# Patient Record
Sex: Female | Born: 1965 | Race: Black or African American | Hispanic: No | Marital: Married | Smoking: Former smoker
Health system: Southern US, Community
[De-identification: ages and names within clinical notes are randomized; demographics above are authoritative.]

## PROBLEM LIST (undated history)

## (undated) DIAGNOSIS — D649 Anemia, unspecified: Secondary | ICD-10-CM

## (undated) HISTORY — PX: ABDOMINAL HYSTERECTOMY: SHX81

## (undated) HISTORY — PX: OVARIAN CYST SURGERY: SHX726

## (undated) HISTORY — DX: Anemia, unspecified: D64.9

---

## 2002-11-29 ENCOUNTER — Other Ambulatory Visit: Admission: RE | Admit: 2002-11-29 | Discharge: 2002-11-29 | Payer: Self-pay | Admitting: Obstetrics and Gynecology

## 2005-01-29 ENCOUNTER — Other Ambulatory Visit: Admission: RE | Admit: 2005-01-29 | Discharge: 2005-01-29 | Payer: Self-pay | Admitting: Obstetrics & Gynecology

## 2006-08-31 ENCOUNTER — Encounter (INDEPENDENT_AMBULATORY_CARE_PROVIDER_SITE_OTHER): Payer: Self-pay | Admitting: Specialist

## 2006-08-31 ENCOUNTER — Ambulatory Visit (HOSPITAL_COMMUNITY): Admission: RE | Admit: 2006-08-31 | Discharge: 2006-08-31 | Payer: Self-pay | Admitting: Obstetrics and Gynecology

## 2007-11-02 ENCOUNTER — Encounter (INDEPENDENT_AMBULATORY_CARE_PROVIDER_SITE_OTHER): Payer: Self-pay | Admitting: Obstetrics and Gynecology

## 2007-11-02 ENCOUNTER — Inpatient Hospital Stay (HOSPITAL_COMMUNITY): Admission: RE | Admit: 2007-11-02 | Discharge: 2007-11-04 | Payer: Self-pay | Admitting: Obstetrics and Gynecology

## 2010-09-10 NOTE — Op Note (Signed)
Kylie [name], [name] NO.:  [id number]   MEDICAL RECORD NO.:  [id number]          PATIENT TYPE:  INP   LOCATION:  9316                          FACILITY:  [facility]   PHYSICIAN:  Miguel Aschoff, M.D.       DATE OF BIRTH:  08-03-1965   DATE OF PROCEDURE:  11/02/2007  DATE OF DISCHARGE:                               OPERATIVE REPORT   PREOPERATIVE DIAGNOSES:  1. Menorrhagia.  2. Uterine fibroids.   POSTOPERATIVE DIAGNOSES:  1. Menorrhagia.  2. Uterine fibroids.   PROCEDURE:  Laparoscopically-assisted total vaginal hysterectomy.   SURGEON:  Miguel Aschoff, MD   ASSISTANT:  Luvenia Redden, MD   ANESTHESIA:  General.   COMPLICATIONS:  None.   JUSTIFICATION:  The patient is a 45 year old black female with a history  of menorrhagia that has been treated in the past with endometrial  ablation and most recently a Mirena IUD.  In spite of these conservative  measures, the heavy bleeding has persisted with the patient having  uterine fibroids and persistent bleeding.  She presents now to undergo  laparoscopically-assisted hysterectomy as a definitive therapy to  control her heavy menses.  Risks and benefits of this procedure were  discussed with the patient.  In addition, she understands that the  possibility that abdominal hysterectomy may have to be performed has  been discussed and informed consent obtained.   PROCEDURE IN DETAIL:  The patient was taken to the operating room,  placed in supine position, and general anesthesia was administered  without difficulty.  She was then placed in dorsal lithotomy position,  prepped and draped in the usual sterile fashion.  A Foley catheter was  inserted.  At this point, examination under anesthesia revealed normal  external genitalia, normal Bartholin and Skene glands, and normal  urethra.  The vaginal vault was without gross lesion and the cervix was  without gross lesion.  The uterus was noted be enlarged approximately 10  weeks  equivalent size, irregular in shape with myomas present.  After  the examination under anesthesia, a speculum was placed in the vaginal  vault and Hulka tenaculum was placed through the cervix and held.  At  this point, attention was directed to the abdomen where a small  infraumbilical incision was made.  A Veress needle was inserted and then  the abdomen was insufflated with 3 L of CO2.  Following insufflation,  the trocar to laparoscope was placed followed by laparoscope itself.  Then under direct visualization, two accessory ports were established in  the right and left lower quadrants using 5 mm ports.  At this point,  systematic inspection revealed the uterus again to be enlarged and  irregular.  There was a fundal fibroid approximately 4-5 cm in size on  the left portion of the fundus.  The tubes and ovaries were noted be  within normal limits except for what appeared to be evidence of prior  tubal sterilization.  The cul-de-sac was unremarkable.  The anterior  peritoneum was also unremarkable.  Intestinal surfaces were within  normal limits.  At this point, the Gyrus  unit was introduced.  The round  ligament was identified, grasped, cauterized and cut, and then in  similar fashion bilaterally the utero-ovarian ligaments were found,  cauterized, and cut, and then serial bites were taken along the broad  ligament working down to the uterine vessels using serial cauterization  and cuts until the uterine vessels were found.  At this point, the  laparoscopic portion of the procedure was completed.  The patient was  placed in a high dorsal lithotomy position.  Weighted speculum placed in  the vaginal vault, anterior cervical lip was grasped, and the cervix was  injected with 1% Xylocaine with epinephrine for hemostasis.  The  cervical mucosa was then circumscribed and dissected anteriorly and  posteriorly until the peritoneal reflections were found and then  entered.  At this point, the  uterosacral ligaments were found, clamped  with curved Heaney clamps, pedicles cut and suture ligated using suture  ligatures of 0 Vicryl.  These pedicles were then held and then bites  were taken off the cardinal ligaments using curved Heaney clamps.  Again, pedicles were cut and suture ligated using suture ligatures of 0  Vicryl.  Then serial bites were taken of the paracervical fascia again  with curved Heaney clamps and again all suture ligatures of 0 Vicryl  were used.  This continued until the uterine vessels were found,  clamped, cut, and suture ligated and again the anterior peritoneum was  entered without difficulty with care to avoid any injury to the bladder.  Two additional bites were taken of the broad ligament structures using  curved Heaney clamps and again suture ligatures of 0 Vicryl and at this  point, it was possible to deliver the uterine fundus through the cul-de-  sac reducing it in size by extracting the myomas and the residual  structures holding the uterus in place were clamped with curved Heaney  clamps and the specimens were free.  These pedicles were then ligated  using ligatures of 0 Vicryl.  At this point, the posterior vaginal cuff  was run using running interlocking 0 Vicryl suture.  Hemostasis was then  ensured on all pedicles and at this point, the peritoneum was closed  using pursestring suture of 0 Vicryl.  Vaginal mucosa was then  reapproximated using running interlocking 0 Vicryl sutures.  Iodoform  pack was then placed in the vaginal vault to provide additional pressure  hemostasis.  At this point, the patient was taken out of the lithotomy  position and placed in Trendelenburg position.  The laparoscope was  reintroduced.  The abdomen was reinsufflated and all pedicles were then  inspected intra-abdominally and appeared to be dry with good hemostasis.  At this point, the pelvis was irrigated again with saline.  This was  then aspirated.  The CO2 was  then allowed to escape.  All instruments  were removed.  The small infraumbilical incision as well as the  accessory port sites were closed using subcuticular 4-0 Vicryl.  Band-  Aids were applied.  At this point, the patient was reversed from the  anesthetic and taken to the recovery room in satisfactory condition.  The estimated blood loss was approximately 400 mL.  The patient  tolerated the procedure well and went to the recovery room in  satisfactory condition.  The urine remained clear throughout the case.      Miguel Aschoff, M.D.  Electronically Signed     AR/MEDQ  D:  11/02/2007  T:  11/03/2007  Job:  [id number]

## 2010-09-13 NOTE — Discharge Summary (Signed)
Kylie [name], ZAGAL NO.:  [id number]   MEDICAL RECORD NO.:  [id number]          PATIENT TYPE:  INP   LOCATION:  9316                          [facility]ACILITY:  [facility]   PHYSICIAN:  Miguel Aschoff, M.D.       DATE OF BIRTH:  1965/10/23   DATE OF ADMISSION:  11/02/2007  DATE OF DISCHARGE:  11/04/2007                               DISCHARGE SUMMARY   ADMISSION DIAGNOSIS:  Symptomatic uterine fibroids and menorrhagia.   FINAL DIAGNOSIS:  Symptomatic uterine fibroids and menorrhagia.   OPERATIONS AND PROCEDURES:  Laparoscopically-assisted total vaginal  hysterectomy.   ANESTHESIA:  General anesthesia.   BRIEF HISTORY:  The patient is a 45 year old black female with history  of menorrhagia and noted to have uterine fibroids.  Because of her  symptomatology and refractoriness of this condition to medical therapy,  she is being admitted to the hospital at this time to undergo definitive  therapy via laparoscopically-assisted hysterectomy and possible  abdominal hysterectomy.  The patient was informed about the procedures,  the risks, and benefits and signed informed consent.  On November 02, 2007,  after general anesthesia, a laparoscopically-assisted total vaginal  hysterectomy was carried out without difficulty.  Findings at surgery  revealed the presence of significant myomas, the largest one on the  uterine fundus.  The procedure was carried out without difficulty and  the patient had an essentially uncomplicated postoperative course,  except for a drop in her hemoglobin from 10.3-7.7 for which she remained  asymptomatic.  On second postoperative day, she was tolerating regular  diet, ambulating well, tolerating p.o. pain medications, and was felt to  be stable enough to be discharged home.  Medications for home included  Tylox one every 3 hours as needed for pain and she was instructed to  resume her iron treatment.  The patient is to be seen back in 4 weeks  for followup  examination.  She knows to call if there were any problems  such as fever, pain, or heavy bleeding.  Pathology specimen revealed the  uterus weighing 218 g.  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  The patient was sent home in  satisfactory condition on a regular diet.      Miguel Aschoff, M.D.  Electronically Signed     AR/MEDQ  D:  11/09/2007  T:  11/10/2007  Job:  [id number]

## 2010-09-13 NOTE — Op Note (Signed)
Kylie [name], Kylie [name]               ACCOUNT NO.:  [id number]   MEDICAL RECORD NO.:  [id number]          PATIENT TYPE:  AMB   LOCATION:  SDC                           [facility]ILITY:  [facility]   PHYSICIAN:  Kendra H. Tenny Craw, MD     DATE OF BIRTH:  Apr 24, 1966   DATE OF PROCEDURE:  09/03/2006  DATE OF DISCHARGE:  08/31/2006                               OPERATIVE REPORT   PREOPERATIVE DIAGNOSES:  1. Menorrhagia.  2. Anemia.   POSTOPERATIVE DIAGNOSES:  1. Menorrhagia.  2. Anemia.  3. Submucosal fibroids.   OPERATION PERFORMED:  Hysteroscopy, dilation and curettage, attempted  endometrial ablation with Novasure x2, with Thermachoice x1 and Mirena  IUD insertion.   SURGEON:  Freddrick March. Tenny Craw, MD   ESTIMATED BLOOD LOSS:  Approximately 50 mL.   ANESTHESIA:  General   SPECIMENS:  Endometrial curettings, disposition to pathology.   COMPLICATIONS:  Unsuccessful Novasure ablation x2 and Thermachoice  ablation x1, submucosal fibroid at the fundus at approximately 2 o'clock  and a submucosal fibroid at approximately 9 o'clock just inside the  internal cervical os.   INDICATIONS FOR PROCEDURE:  Ms. Swaby is a 45 year old African-American  female who has had approximately one and a half year history of  irregular heavy bleeding associated with anemia with a hemoglobin of 8.  She has attempted cycle control with oral contraceptives, however,  continues to have irregular bleeding and the decision was made to  proceed with attempted endometrial ablation.  Prior to the procedure,  she did have an endometrial biopsy performed which was benign  endometrium.   DESCRIPTION OF PROCEDURE:  She was taken to the operating room after the  appropriate informed consent and was prepped and draped in the normal  sterile fashion and placed in the dorsal supine position with the Allen  stirrups.  A single toothed tenaculum was placed on the anterior lip of  the cervix.  The uterus was sounded to 9 cm.  The cervix  was serially  dilated and the 0 degree hysteroscope was passed transcervically.  On  initial inspection the ostia were visualized x2.  The surface of the  endometrium was benign-appearing and the hysteroscope was removed. A  sharp curettage was performed.  On curettage there was noted to be some  irregular surface of the endometrial cavity felt consistent with  submucosal fibroids.  After the sharp curettage was performed, the  cervical length was measured to be 5 cm.  The Novasure device was placed  transcervically and the array was deployed.  The cavity width was  measured to be 3.1 cm.  The cavity assessment was performed; however,  the cavity assessment was not successful and despite multiple  techniques, we were not able to achieve a successful cavity assessment.  A second Novasure array was attempted and a second Novasure machine was  attempted and a successful cavity assessment was never able to be  achieved.  At this time the Novasure array was removed from the  endometrial cavity and the decision was made to attempt a Thermachoice  ablation.  D5W was placed into the hysteroscopic  tubing.  The  hysteroscopic tubing was flushed as was the hysteroscope.  The  hysteroscope was then passed transcervically and the uterine cavity was  flushed of normal saline.  At this time a small submucosal fibroid was  noted on the left hand side of the uterus just past the internal os and  also another submucosal fibroid was noted at the fundus on the right  hand side of the uterus.  No evidence of perforation was noted and the  fluid deficit remained less than 100 mL.  At this time the Thermachoice  ablative device was primed and passed transcervically.  However, despite  two attempts we were never able to maintain enough pressure to perform  the Thermachoice ablation.  At this time, the decision was made to place  a Jearld Adjutant IUD for short term control of the patient's menorrhagia.  The  Jearld Adjutant was  placed transcervically without difficulty.  This concluded  the operative procedure.  The patient was awoken from general anesthesia  and extubated in the operating room and brought to the recovery room in  stable condition following the procedure.  The patient was advised of  the unsuccessful attempt of Novasure and Thermachoice ablation and  placement of the [facility] IUD for short term control of her menorrhagia  and advised that she may require definitive surgical management with  hysterectomy for definitive control of her menorrhagia.      Freddrick March. Tenny Craw, MD  Electronically Signed     KHR/MEDQ  D:  09/03/2006  T:  09/03/2006  Job:  [id number]

## 2010-09-13 NOTE — Discharge Summary (Signed)
[name], [name] NO.:  [id number]   MEDICAL RECORD NO.:  [id number]          PATIENT TYPE:  INP   LOCATION:  9316                          [facility]   PHYSICIAN:  Miguel Aschoff, M.D.       DATE OF BIRTH:  19-Jan-1966   DATE OF ADMISSION:  11/02/2007  DATE OF DISCHARGE:  11/04/2007                               DISCHARGE SUMMARY   ADMISSION DIAGNOSIS:  Menorrhagia.   FINAL DIAGNOSES:  Menorrhagia with leiomyomas, superficial adenomyosis.   OPERATIONS AND PROCEDURES:  Laparoscopically-assisted total vaginal  hysterectomy.   BRIEF HISTORY:  The patient is a 45 year old black female with history  of menorrhagia treated in the past with a Mirena IUD and an endometrial  ablation.  However, these conservative measures did not result in arrest  of the patient's heavy vaginal bleeding and she presents now to undergo  definitive therapy for menorrhagia.  The risks and benefits of the  surgical procedures were discussed with the patient and preoperative  labs were obtained.  Preoperative labs revealed a hemoglobin of 10.3,  hematocrit 33.3, white count of 5600.  PT and PTT within normal limits.  The patient's blood type is noted to be A positive.   HOSPITAL COURSE:  On November 02, 2007, under general anesthesia,  laparoscopically-assisted total vaginal hysterectomy was carried out  without difficulty.  The patient had an essentially uncomplicated  postoperative course.  She tolerated increasing ambulation and diet  well.  The only remarkable postoperative issue was a drop in her  hemoglobin to 7.7; however, the patient remained asymptomatic.  By the  second postoperative day, the patient was in satisfactory condition and  discharged home.   Medications for home included Tylox 1 every 3 hours as needed for pain,  Slow Fe Iron 2 tablets per day.  She was instructed on no heavy lifting,  place nothing in vagina, to return to the office in 4 weeks for followup  examination.  The pathology report on the hysterectomy specimen revealed  superficial adenomyosis, benign leiomyomas, chronic cervicitis.      Miguel Aschoff, M.D.  Electronically Signed     AR/MEDQ  D:  11/24/2007  T:  11/24/2007  Job:  [id number]

## 2011-01-23 LAB — CROSSMATCH: Antibody Screen: NEGATIVE

## 2011-01-23 LAB — CBC
HCT: 24.9 — ABNORMAL LOW
HCT: 26.7 — ABNORMAL LOW
HCT: 33.3 — ABNORMAL LOW
Hemoglobin: 10.3 — ABNORMAL LOW
Hemoglobin: 7.7 — CL
Hemoglobin: 8.5 — ABNORMAL LOW
MCHC: 30.8
MCV: 71.1 — ABNORMAL LOW
MCV: 71.9 — ABNORMAL LOW
Platelets: 230
Platelets: 275
RDW: 22.1 — ABNORMAL HIGH
RDW: 22.8 — ABNORMAL HIGH
RDW: 23.1 — ABNORMAL HIGH
WBC: 11 — ABNORMAL HIGH

## 2011-01-23 LAB — DIFFERENTIAL
Eosinophils Absolute: 0
Eosinophils Relative: 1
Lymphocytes Relative: 32
Lymphs Abs: 1.8
Monocytes Absolute: 0.4

## 2011-01-23 LAB — HEMOGLOBIN AND HEMATOCRIT, BLOOD: HCT: 28.8 — ABNORMAL LOW

## 2011-01-23 LAB — PROTIME-INR
INR: 1
Prothrombin Time: 13.1

## 2012-09-02 ENCOUNTER — Other Ambulatory Visit: Payer: Self-pay | Admitting: Internal Medicine

## 2012-09-02 DIAGNOSIS — Z1231 Encounter for screening mammogram for malignant neoplasm of breast: Secondary | ICD-10-CM

## 2012-10-13 ENCOUNTER — Ambulatory Visit: Payer: Self-pay

## 2013-02-15 ENCOUNTER — Other Ambulatory Visit: Payer: Self-pay | Admitting: Obstetrics and Gynecology

## 2013-02-15 DIAGNOSIS — Z1231 Encounter for screening mammogram for malignant neoplasm of breast: Secondary | ICD-10-CM

## 2013-03-04 ENCOUNTER — Ambulatory Visit
Admission: RE | Admit: 2013-03-04 | Discharge: 2013-03-04 | Disposition: A | Discharge status: Home / Self Care | Payer: BC Managed Care – PPO | Source: Ambulatory Visit | Attending: Obstetrics and Gynecology | Admitting: Obstetrics and Gynecology

## 2013-03-04 DIAGNOSIS — Z1231 Encounter for screening mammogram for malignant neoplasm of breast: Secondary | ICD-10-CM

## 2013-04-28 ENCOUNTER — Emergency Department (HOSPITAL_COMMUNITY): Payer: BC Managed Care – PPO

## 2013-04-28 ENCOUNTER — Emergency Department (HOSPITAL_COMMUNITY)
Admission: EM | Admit: 2013-04-28 | Discharge: 2013-04-28 | Disposition: A | Discharge status: Home / Self Care | Payer: BC Managed Care – PPO | Attending: Emergency Medicine | Admitting: Emergency Medicine

## 2013-04-28 ENCOUNTER — Encounter (HOSPITAL_COMMUNITY): Payer: Self-pay | Admitting: Emergency Medicine

## 2013-04-28 DIAGNOSIS — X500XXA Overexertion from strenuous movement or load, initial encounter: Secondary | ICD-10-CM | POA: Insufficient documentation

## 2013-04-28 DIAGNOSIS — Z87891 Personal history of nicotine dependence: Secondary | ICD-10-CM | POA: Diagnosis not present

## 2013-04-28 DIAGNOSIS — S92109A Unspecified fracture of unspecified talus, initial encounter for closed fracture: Secondary | ICD-10-CM | POA: Insufficient documentation

## 2013-04-28 DIAGNOSIS — S8990XA Unspecified injury of unspecified lower leg, initial encounter: Secondary | ICD-10-CM | POA: Diagnosis present

## 2013-04-28 DIAGNOSIS — Y9389 Activity, other specified: Secondary | ICD-10-CM | POA: Diagnosis not present

## 2013-04-28 DIAGNOSIS — S92009A Unspecified fracture of unspecified calcaneus, initial encounter for closed fracture: Secondary | ICD-10-CM | POA: Diagnosis not present

## 2013-04-28 DIAGNOSIS — Z79899 Other long term (current) drug therapy: Secondary | ICD-10-CM | POA: Diagnosis not present

## 2013-04-28 DIAGNOSIS — Y929 Unspecified place or not applicable: Secondary | ICD-10-CM | POA: Diagnosis not present

## 2013-04-28 DIAGNOSIS — S99919A Unspecified injury of unspecified ankle, initial encounter: Secondary | ICD-10-CM | POA: Diagnosis present

## 2013-04-28 DIAGNOSIS — S82891A Other fracture of right lower leg, initial encounter for closed fracture: Secondary | ICD-10-CM

## 2013-04-28 MED ORDER — HYDROCODONE-ACETAMINOPHEN 5-325 MG PO TABS: 1.0000 | ORAL_TABLET | ORAL | Status: DC | PRN

## 2013-04-28 NOTE — ED Notes (Signed)
Pt states she stepped off a step wrong and bent her right foot under.  Pt c/o pain and swelling to right lateral foot.  Pt has good pulses in extremity and skin is warm and dry to touch.

## 2013-04-28 NOTE — Discharge Instructions (Signed)
Read the information below.  Use the prescribed medication as directed.  Please discuss all new medications with your pharmacist.  Do not take additional tylenol while taking the prescribed pain medication to avoid overdose.  You may return to the Emergency Department at any time for worsening condition or any new symptoms that concern you.  If you develop uncontrolled pain, weakness or numbness of the extremity, severe discoloration of the skin, or you are unable to move your ankle or foot , return to the ER for a recheck.

## 2013-04-28 NOTE — ED Notes (Signed)
Pt presents with c/o right foot injury. Pt was walking down some stairs and missed the last step, pt says she twisted her foot at this time. Pt now presenting with pain on the top of her foot. Ambulatory to triage.

## 2013-04-28 NOTE — ED Provider Notes (Signed)
Medical screening examination/treatment/procedure(s) were performed by non-physician practitioner and as supervising physician I was immediately available for consultation/collaboration.  Charnell Peplinski L Keishawn Darsey, MD 04/28/13 2309 

## 2013-04-28 NOTE — ED Provider Notes (Signed)
CSN: [id number]     Arrival date & time 04/28/13  1519 History  This chart was scribed for non-physician practitioner Clayton Bibles, PA-C working with Richarda Blade, MD by Anastasia Pall, ED scribe. This patient was seen in room [place] and the patient's care was started at 4:39 PM.   Chief Complaint  Patient presents with  . Foot Injury    The history is provided by the patient. No language interpreter was used.   HPI Comments: Kylie [name] is a 48 y.o. female who presents to the Emergency Department complaining of sudden, mild, constant, right foot pain, over the top of her foot, with associated swelling, onset yesterday when she twisted her foot after missing the last step on some stairs. She denies pain to her foot when resting, but reports pain with ambulation. She denies fever, vomiting, back pain, and any other associated symptoms. Denies other injury.   PCP - No primary provider on file.  History reviewed. No pertinent past medical history. Past Surgical History  Procedure Laterality Date  . Abdominal hysterectomy    . Cesarean section     No family history on file. History  Substance Use Topics  . Smoking status: Former Research scientist (life sciences)  . Smokeless tobacco: Never Used  . Alcohol Use: No   OB History   Grav Para Term Preterm Abortions TAB SAB Ect Mult Living                 Review of Systems  Constitutional: Negative for fever.  Gastrointestinal: Negative for vomiting.  Musculoskeletal: Positive for arthralgias (right foot). Negative for back pain.    Allergies  Review of patient's allergies indicates no known allergies.  Home Medications   Current Outpatient Rx  Name  Route  Sig  Dispense  Refill  . Green Tea, Camillia sinensis, (GREEN TEA PO)   Oral   Take 1 tablet by mouth daily.         . Multiple Vitamins-Minerals (MULTIPLE VITAMINS/WOMENS PO)   Oral   Take 1 each by mouth daily.         . phentermine (ADIPEX-P) 37.5 MG tablet   Oral   Take 37.5 mg by  mouth daily before breakfast.           BP 132/87  Pulse 103  Temp(Src) 98.5 F (36.9 C) (Oral)  Resp 18  SpO2 100%  Physical Exam  Nursing note and vitals reviewed. Constitutional: She appears well-developed and well-nourished. No distress.  HENT:  Head: Normocephalic and atraumatic.  Neck: Neck supple.  Cardiovascular:   Right foot cap refill < 2 seconds.   Pulmonary/Chest: Effort normal.  Musculoskeletal:  Swelling and tenderness over lateral mall and dorsal foot on right side. Can move all digits and ankle. No calf tenderness.   Neurological: She is alert.  Sensation of right foot intact.  Skin: She is not diaphoretic.    ED Course  Procedures (including critical care time)  DIAGNOSTIC STUDIES: Oxygen Saturation is 100% on room air, normal by my interpretation.    COORDINATION OF CARE: 4:41 PM-Discussed treatment plan which includes right foot xray with pt at bedside and pt agreed to plan.   Labs Review Labs Reviewed - No data to display Imaging Review No results found.  EKG Interpretation   None      Discussed cam walker vs splint with patient. Patient works in [employer] and is also uncomfortable using crutches. She strongly prefers the cam walker  MDM   1.  Avulsion fracture of ankle, right, closed, initial encounter    Patient with right foot swelling and mild pain while walking downstairs in missing a step causing her to invert her foot. She has 2 avulsion fractures per x-ray. Please see discussion above about treatment. Patient discharged with cam walker, Norco, orthopedic followup.  Discussed result, findings, treatment, and follow up  with patient.  Pt given return precautions.  Pt verbalizes understanding and agrees with plan.        I personally performed the services described in this documentation, which was scribed in my presence. The recorded information has been reviewed and is accurate.     Clayton Bibles, PA-C 04/28/13 1858

## 2013-05-24 ENCOUNTER — Ambulatory Visit (INDEPENDENT_AMBULATORY_CARE_PROVIDER_SITE_OTHER): Payer: BC Managed Care – PPO | Admitting: Family Medicine

## 2013-05-24 VITALS — BP 132/84 | HR 98 | Temp 98.2°F | Resp 16 | Ht 67.5 in | Wt 269.0 lb

## 2013-05-24 DIAGNOSIS — M25579 Pain in unspecified ankle and joints of unspecified foot: Secondary | ICD-10-CM

## 2013-05-24 DIAGNOSIS — M25571 Pain in right ankle and joints of right foot: Secondary | ICD-10-CM

## 2013-05-24 NOTE — Progress Notes (Signed)
[facility] [address], [place] [address] [phone]  Date:  05/24/2013   Name:  Kylie [name]   DOB:  April 23, 1966   MRN:  [id number]  PCP:  No primary provider on file.    Chief Complaint: Annual Exam   History of Present Illness:  Kylie [name] is a 48 y.o. very pleasant female patient who presents with the following:  She is here today for a PE to take care of foster children.  She has an ankle fracture right now in a CAM boot- this is being treated by her orthopedist.  She hopes to be out of the boot in 2 weeks.  She does take norco prn for her ankle but rarely uses.  She is on phentermine.   Otherwise she is generally healthy.   On her form there are the same questions we use to screen for TB.  She has answered negatively to all.   She does not desire full PE services today  There are no active problems to display for this patient.   No past medical history on file.  Past Surgical History  Procedure Laterality Date  . Abdominal hysterectomy    . Cesarean section      History  Substance Use Topics  . Smoking status: Former Research scientist (life sciences)  . Smokeless tobacco: Never Used  . Alcohol Use: No    No family history on file.  No Known Allergies  Medication list has been reviewed and updated.  Current Outpatient Prescriptions on File Prior to Visit  Medication Sig Dispense Refill  . Green Tea, Camillia sinensis, (GREEN TEA PO) Take 1 tablet by mouth daily.      . Multiple Vitamins-Minerals (MULTIPLE VITAMINS/WOMENS PO) Take 1 each by mouth daily.      . phentermine (ADIPEX-P) 37.5 MG tablet Take 37.5 mg by mouth daily before breakfast.      . HYDROcodone-acetaminophen (NORCO/VICODIN) 5-325 MG per tablet Take 1 tablet by mouth every 4 (four) hours as needed.  15 tablet  0   No current facility-administered medications on file prior to visit.    Review of Systems:  As per HPI- otherwise negative.   Physical Examination: Filed Vitals:   05/24/13 1116  BP: 132/84  Pulse: 98  Temp: 98.2 F (36.8 C)  Resp: 16   Filed Vitals:   05/24/13 1116  Height: 5' 7.5" (1.715 m)  Weight: 269 lb (122.018 kg)   Body mass index is 41.49 kg/(m^2). Ideal Body Weight: Weight in (lb) to have BMI = 25: 161.7  GEN: WDWN, NAD, Non-toxic, A & O x 3 HEENT: Atraumatic, Normocephalic. Neck supple. No masses, No LAD.  Bilateral TM wnl, oropharynx normal.  PEERL,EOMI.   Ears and Nose: No external deformity. CV: RRR, No M/G/R. No JVD. No thrill. No extra heart sounds. PULM: CTA B, no wheezes, crackles, rhonchi. No retractions. No resp. distress. No accessory muscle use. ABD: S, NT, ND. No rebound. No HSM. EXTR: No c/c/e NEURO using crutches and a CAM walker boot PSYCH: Normally interactive. Conversant. Not depressed or anxious appearing.  Calm demeanor.  Right ankle: tender and bruised over the dorsum of the foot.  Ankle joint is stable  Assessment and Plan: Right ankle pain  Completed form for foster program Declines a flu shot today  Signed Lamar Blinks, MD

## 2014-08-22 ENCOUNTER — Encounter (HOSPITAL_COMMUNITY): Payer: Self-pay | Admitting: Emergency Medicine

## 2014-08-22 ENCOUNTER — Emergency Department (HOSPITAL_COMMUNITY)
Admission: EM | Admit: 2014-08-22 | Discharge: 2014-08-22 | Disposition: A | Discharge status: Home / Self Care | Payer: BC Managed Care – PPO | Attending: Emergency Medicine | Admitting: Emergency Medicine

## 2014-08-22 DIAGNOSIS — Z87891 Personal history of nicotine dependence: Secondary | ICD-10-CM | POA: Insufficient documentation

## 2014-08-22 DIAGNOSIS — Y998 Other external cause status: Secondary | ICD-10-CM | POA: Diagnosis not present

## 2014-08-22 DIAGNOSIS — Y9389 Activity, other specified: Secondary | ICD-10-CM | POA: Insufficient documentation

## 2014-08-22 DIAGNOSIS — X58XXXA Exposure to other specified factors, initial encounter: Secondary | ICD-10-CM | POA: Diagnosis not present

## 2014-08-22 DIAGNOSIS — Y929 Unspecified place or not applicable: Secondary | ICD-10-CM | POA: Insufficient documentation

## 2014-08-22 DIAGNOSIS — Z79899 Other long term (current) drug therapy: Secondary | ICD-10-CM | POA: Diagnosis not present

## 2014-08-22 DIAGNOSIS — S93401A Sprain of unspecified ligament of right ankle, initial encounter: Secondary | ICD-10-CM | POA: Insufficient documentation

## 2014-08-22 DIAGNOSIS — S99911A Unspecified injury of right ankle, initial encounter: Secondary | ICD-10-CM | POA: Diagnosis present

## 2014-08-22 NOTE — Discharge Instructions (Signed)
Please read and follow all provided instructions.  Your diagnoses today include:  1. Ankle sprain, right, initial encounter     Tests performed today include:  Vital signs. See below for your results today.   Medications prescribed:   Ibuprofen (Motrin, Advil) - anti-inflammatory pain medication  Do not exceed 600mg  ibuprofen every 6 hours, take with food  You have been prescribed an anti-inflammatory medication or NSAID. Take with food. Take smallest effective dose for the shortest duration needed for your pain. Stop taking if you experience stomach pain or vomiting.   Take any prescribed medications only as directed.  Home care instructions:   Follow any educational materials contained in this packet  Follow R.I.C.E. Protocol:  R - rest your injury   I  - use ice on injury without applying directly to skin  C - compress injury with bandage or splint  E - elevate the injury as much as possible  Follow-up instructions: Please follow-up with your primary care provider if you continue to have significant pain or trouble walking in 1 week. In this case you may have a severe sprain that requires further care.   Return instructions:   Please return if your toes are numb or tingling, appear gray or blue, or you have severe pain (also elevate leg and loosen splint or wrap)  Please return to the Emergency Department if you experience worsening symptoms.   Please return if you have any other emergent concerns.  Additional Information:  Your vital signs today were: BP 126/76 mmHg   Pulse 78   Temp(Src) 98.2 F (36.8 C) (Oral)   Resp 16   Ht 5\' 7"  (1.702 m)   Wt 269 lb (122.018 kg)   BMI 42.12 kg/m2   SpO2 100% If your blood pressure (BP) was elevated above 135/85 this visit, please have this repeated by your doctor within one month. -------------- Your caregiver has diagnosed you as suffering from an ankle sprain. Ankle sprain occurs when the ligaments that hold the ankle  joint together are stretched or torn. It may take 4 to 6 weeks to heal.  For Activity: If prescribed crutches, use crutches with non-weight bearing for the first few days. Then, you may walk on your ankle as the pain allows, or as instructed. Start gradually with weight bearing on the affected ankle. Once you can walk pain free, then try jogging. When you can run forwards, then you can try moving side-to-side. If you cannot walk without crutches in one week, you need a re-check. --------------

## 2014-08-22 NOTE — ED Provider Notes (Signed)
CSN: [id number]     Arrival date & time 08/22/14  1247 History  This chart was scribed for Alecia Lemming PA-C working with Fredia Sorrow, MD by Mercy Moore, ED Scribe. This patient was seen in room TR07C/TR07C and the patient's care was started at 1:13 PM.   Chief Complaint  Patient presents with  . Ankle Pain   The history is provided by the patient. No language interpreter was used.   HPI Comments: Kylie [name] is a 49 y.o. female who presents to the Emergency Department with a right ankle injury incurred yesterday. Patient reports inverting her right ankle on a curb while unloading groceries from her car. Patient denies falling or any additional injuries. Patient denies swelling  following the injury, but reports gradual onset throbbing pain to dorsum approximately three hours later with drastic worsening last night. Patient reports treatment with ibuprofen, with some relief. Patient denies knee involvement. Patient denies numbness or tingling in her toes. Patient reports previous fracture to the ankle several years ago. Patient has been ambulating with the crutches from her previous injury.   History reviewed. No pertinent past medical history. Past Surgical History  Procedure Laterality Date  . Abdominal hysterectomy    . Cesarean section     No family history on file. History  Substance Use Topics  . Smoking status: Former Research scientist (life sciences)  . Smokeless tobacco: Never Used  . Alcohol Use: No   OB History    No data available     Review of Systems  Constitutional: Negative for fever, chills and activity change.  Musculoskeletal: Positive for arthralgias. Negative for back pain, joint swelling, gait problem and neck pain.  Skin: Negative for color change and wound.  Neurological: Negative for weakness and numbness.      Allergies  Review of patient's allergies indicates no known allergies.  Home Medications   Prior to Admission medications   Medication Sig Start Date End Date  Taking? Authorizing Provider  Nyoka Cowden Tea, Camillia sinensis, (GREEN TEA PO) Take 1 tablet by mouth daily.    Historical Provider, MD  HYDROcodone-acetaminophen (NORCO/VICODIN) 5-325 MG per tablet Take 1 tablet by mouth every 4 (four) hours as needed. 04/28/13   Clayton Bibles, PA-C  Multiple Vitamins-Minerals (MULTIPLE VITAMINS/WOMENS PO) Take 1 each by mouth daily.    Historical Provider, MD  phentermine (ADIPEX-P) 37.5 MG tablet Take 37.5 mg by mouth daily before breakfast.    Historical Provider, MD   Triage Vitals: BP 126/76 mmHg  Pulse 78  Temp(Src) 98.2 F (36.8 C) (Oral)  Resp 16  Ht 5\' 7"  (1.702 m)  Wt 269 lb (122.018 kg)  BMI 42.12 kg/m2  SpO2 100%  Physical Exam  Constitutional: She is oriented to person, place, and time. She appears well-developed and well-nourished. No distress.  HENT:  Head: Normocephalic and atraumatic.  Eyes: EOM are normal. Pupils are equal, round, and reactive to light.  Neck: Normal range of motion. Neck supple. No tracheal deviation present.  Cardiovascular: Normal rate.  Exam reveals no decreased pulses.   Pulmonary/Chest: Effort normal. No respiratory distress.  Musculoskeletal: Normal range of motion. She exhibits no edema or tenderness.  No tenderness at time of exam. Mentions slight pressure medial R ankle with standing, but with normal gait.   Neurological: She is alert and oriented to person, place, and time. No sensory deficit.  Motor, sensation, and vascular distal to the injury is fully intact.   Skin: Skin is warm and dry.  Psychiatric: She has a  normal mood and affect. Her behavior is normal.  Nursing note and vitals reviewed.   ED Course  Procedures (including critical care time)  COORDINATION OF CARE:  1:19 PM- Discussed treatment plan with patient at bedside and patient agreed to plan. Patient agrees no x-ray needed at this time.   Labs Review Labs Reviewed - No data to display  Imaging Review No results found.   EKG  Interpretation None       Vital signs reviewed and are as follows: Filed Vitals:   08/22/14 1251  BP: 126/76  Pulse: 78  Temp: 98.2 F (36.8 C)  Resp: 16   1:36 PM Patient was counseled on RICE protocol and told to rest injury, use ice for no longer than 15 minutes every hour, compress the area, and elevate above the level of their heart as much as possible to reduce swelling. Questions answered. Patient verbalized understanding.     MDM   Final diagnoses:  Ankle sprain, right, initial encounter   Patient with ankle injury. Currently no pain. No indication for x-ray given [place] ankle rules. Lower extremity is neurovascularly intact.  I personally performed the services described in this documentation, which was scribed in my presence. The recorded information has been reviewed and is accurate.    Carlisle Cater, PA-C 08/22/14 [place], MD 08/24/14 1723

## 2014-08-22 NOTE — ED Notes (Signed)
Patient stepped off curbing wrong and twisted R ankle.   Patient denies pain at this time.   No swelling noted.   Patient states took ibuprofen this morning and it helped a lot.

## 2015-05-11 ENCOUNTER — Other Ambulatory Visit: Payer: Self-pay

## 2015-05-11 DIAGNOSIS — Z1231 Encounter for screening mammogram for malignant neoplasm of breast: Secondary | ICD-10-CM

## 2015-05-22 ENCOUNTER — Ambulatory Visit
Admission: RE | Admit: 2015-05-22 | Discharge: 2015-05-22 | Disposition: A | Discharge status: Home / Self Care | Payer: BC Managed Care – PPO | Source: Ambulatory Visit

## 2015-05-22 DIAGNOSIS — Z1231 Encounter for screening mammogram for malignant neoplasm of breast: Secondary | ICD-10-CM

## 2015-06-15 ENCOUNTER — Encounter: Payer: Self-pay | Admitting: Internal Medicine

## 2015-06-15 ENCOUNTER — Other Ambulatory Visit (INDEPENDENT_AMBULATORY_CARE_PROVIDER_SITE_OTHER): Payer: BC Managed Care – PPO

## 2015-06-15 ENCOUNTER — Ambulatory Visit (INDEPENDENT_AMBULATORY_CARE_PROVIDER_SITE_OTHER): Payer: BC Managed Care – PPO | Admitting: Internal Medicine

## 2015-06-15 VITALS — BP 118/72 | HR 90 | Temp 98.5°F | Resp 16 | Ht 67.5 in | Wt 263.0 lb

## 2015-06-15 DIAGNOSIS — I83813 Varicose veins of bilateral lower extremities with pain: Secondary | ICD-10-CM

## 2015-06-15 DIAGNOSIS — Z0000 Encounter for general adult medical examination without abnormal findings: Secondary | ICD-10-CM

## 2015-06-15 DIAGNOSIS — R21 Rash and other nonspecific skin eruption: Secondary | ICD-10-CM | POA: Diagnosis not present

## 2015-06-15 LAB — LIPID PANEL
CHOLESTEROL: 173 mg/dL (ref 0–200)
HDL: 58.8 mg/dL (ref >39.00)
LDL CALC: 101 mg/dL — AB (ref 0–99)
NONHDL: 114.23
Total CHOL/HDL Ratio: 3
Triglycerides: 64 mg/dL (ref 0.0–149.0)
VLDL: 12.8 mg/dL (ref 0.0–40.0)

## 2015-06-15 LAB — COMPREHENSIVE METABOLIC PANEL
ALBUMIN: 4.3 g/dL (ref 3.5–5.2)
ALK PHOS: 61 U/L (ref 39–117)
ALT: 15 U/L (ref 0–35)
AST: 19 U/L (ref 0–37)
BUN: 11 mg/dL (ref 6–23)
CHLORIDE: 104 meq/L (ref 96–112)
CO2: 30 mEq/L (ref 19–32)
CREATININE: 0.92 mg/dL (ref 0.40–1.20)
Calcium: 9.5 mg/dL (ref 8.4–10.5)
GFR: 82.84 mL/min (ref >60.00)
GLUCOSE: 65 mg/dL — AB (ref 70–99)
POTASSIUM: 4.3 meq/L (ref 3.5–5.1)
SODIUM: 139 meq/L (ref 135–145)
TOTAL PROTEIN: 7.5 g/dL (ref 6.0–8.3)
Total Bilirubin: 0.5 mg/dL (ref 0.2–1.2)

## 2015-06-15 LAB — HEMOGLOBIN A1C: HEMOGLOBIN A1C: 5.5 % (ref 4.6–6.5)

## 2015-06-15 MED ORDER — TACROLIMUS 0.1 % EX OINT: TOPICAL_OINTMENT | Freq: Two times a day (BID) | CUTANEOUS | Status: DC

## 2015-06-15 MED ORDER — HYDROCORTISONE 1 % EX OINT: 1.0000 "application " | TOPICAL_OINTMENT | Freq: Two times a day (BID) | CUTANEOUS | Status: DC

## 2015-06-15 MED ORDER — PERMETHRIN 5 % EX CREA: 1.0000 "application " | TOPICAL_CREAM | Freq: Once | CUTANEOUS | Status: DC

## 2015-06-15 NOTE — Patient Instructions (Signed)
We have sent in the treatment for the scabies just in case.   We will check the blood work today and call you back with the results.  We will send you to the vascular doctor for the veins and to the GI doctor to get the colon cancer screening.   Exercising to Stay Healthy Exercising regularly is important. It has many health benefits, such as:  Improving your overall fitness, flexibility, and endurance.  Increasing your bone density.  Helping with weight control.  Decreasing your body fat.  Increasing your muscle strength.  Reducing stress and tension.  Improving your overall health. In order to become healthy and stay healthy, it is recommended that you do moderate-intensity and vigorous-intensity exercise. You can tell that you are exercising at a moderate intensity if you have a higher heart rate and faster breathing, but you are still able to hold a conversation. You can tell that you are exercising at a vigorous intensity if you are breathing much harder and faster and cannot hold a conversation while exercising. HOW OFTEN SHOULD I EXERCISE? Choose an activity that you enjoy and set realistic goals. Your health care provider can help you to make an activity plan that works for you. Exercise regularly as directed by your health care provider. This may include:   Doing resistance training twice each week, such as:  Push-ups.  Sit-ups.  Lifting weights.  Using resistance bands.  Doing a given intensity of exercise for a given amount of time. Choose from these options:  150 minutes of moderate-intensity exercise every week.  75 minutes of vigorous-intensity exercise every week.  A mix of moderate-intensity and vigorous-intensity exercise every week. Children, pregnant women, people who are out of shape, people who are overweight, and older adults may need to consult a health care provider for individual recommendations. If you have any sort of medical condition, be sure to  consult your health care provider before starting a new exercise program.  WHAT ARE SOME EXERCISE IDEAS? Some moderate-intensity exercise ideas include:   Walking at a rate of 1 mile in 15 minutes.  Biking.  Hiking.  Golfing.  Dancing. Some vigorous-intensity exercise ideas include:   Walking at a rate of at least 4.5 miles per hour.  Jogging or running at a rate of 5 miles per hour.  Biking at a rate of at least 10 miles per hour.  Lap swimming.  Roller-skating or in-line skating.  Cross-country skiing.  Vigorous competitive sports, such as football, basketball, and soccer.  Jumping rope.  Aerobic dancing. WHAT ARE SOME EVERYDAY ACTIVITIES THAT CAN HELP ME TO GET EXERCISE?  Yard work, such as:  Psychologist, educational.  Raking and bagging leaves.  Washing and waxing your car.  Pushing a stroller.  Shoveling snow.  Gardening.  Washing windows or floors. HOW CAN I BE MORE ACTIVE IN MY DAY-TO-DAY ACTIVITIES?  Use the stairs instead of the elevator.  Take a walk during your lunch break.  If you drive, park your car farther away from work or school.  If you take public transportation, get off one stop early and walk the rest of the way.  Make all of your phone calls while standing up and walking around.  Get up, stretch, and walk around every 30 minutes throughout the day. WHAT GUIDELINES SHOULD I FOLLOW WHILE EXERCISING?  Do not exercise so much that you hurt yourself, feel dizzy, or get very short of breath.  Consult your health care provider before starting  a new exercise program.  Wear comfortable clothes and shoes with good support.  Drink plenty of water while you exercise to prevent dehydration or heat stroke. Body water is lost during exercise and must be replaced.  Work out until you breathe faster and your heart beats faster.   This information is not intended to replace advice given to you by your health care provider. Make sure you  discuss any questions you have with your health care provider.   Document Released: 05/17/2010 Document Revised: 05/05/2014 Document Reviewed: 09/15/2013 Elsevier Interactive Patient Education [employer].

## 2015-06-15 NOTE — Progress Notes (Signed)
Pre visit review using our clinic review tool, if applicable. No additional management support is needed unless otherwise documented below in the visit note. 

## 2015-06-15 NOTE — Progress Notes (Signed)
   Subjective:    Patient ID: Kylie [name], female    DOB: 12-Mar-1966, 50 y.o.   MRN: [id number]  HPI The patient is a new 50 YO female coming in for her varicose veins. They are causing her legs to swell. They hurt her at night time when she is lying down. The swelling is worse throughout the day. Better in the morning. She has a strong family history of varicose veins.   PMH, [facility], social history reviewed and updated.   Review of Systems  Constitutional: Negative for fever, activity change, appetite change, fatigue and unexpected weight change.  HENT: Negative.   Eyes: Negative.   Respiratory: Negative for cough, chest tightness, shortness of breath and wheezing.   Cardiovascular: Positive for leg swelling. Negative for chest pain and palpitations.  Gastrointestinal: Negative for nausea, abdominal pain, diarrhea, constipation and abdominal distention.  Musculoskeletal: Positive for myalgias and arthralgias.  Skin: Positive for color change.  Neurological: Negative.   Psychiatric/Behavioral: Negative.       Objective:   Physical Exam  Constitutional: She appears well-developed and well-nourished.  Overweight  HENT:  Head: Normocephalic and atraumatic.  Eyes: EOM are normal.  Neck: Normal range of motion.  Cardiovascular: Normal rate and regular rhythm.   Pulmonary/Chest: Effort normal and breath sounds normal. No respiratory distress. She has no wheezes. She has no rales.  Abdominal: Soft. Bowel sounds are normal. She exhibits no distension. There is no tenderness. There is no rebound.  Musculoskeletal: She exhibits edema.  Some bruising on her upper arms, small sore on the upper left arm, not typical for scabies. Trace edema in the ankles  Neurological: Coordination normal.  Skin: Skin is warm and dry.  Psychiatric: She has a normal mood and affect.   Filed Vitals:   06/15/15 0909  BP: 118/72  Pulse: 90  Temp: 98.5 F (36.9 C)  TempSrc: Oral  Resp: 16  Height: 5'  7.5" (1.715 m)  Weight: 263 lb (119.296 kg)  SpO2: 99%      Assessment & Plan:

## 2015-06-17 DIAGNOSIS — I83819 Varicose veins of unspecified lower extremities with pain: Secondary | ICD-10-CM | POA: Insufficient documentation

## 2015-06-17 DIAGNOSIS — R21 Rash and other nonspecific skin eruption: Secondary | ICD-10-CM | POA: Insufficient documentation

## 2015-06-17 NOTE — Assessment & Plan Note (Addendum)
Will treat for scabies for her peace of mind although this is not typical for scabies. No burrowing and location not typical. Rx given.

## 2015-06-17 NOTE — Assessment & Plan Note (Signed)
Referral to vascular surgery for evaluation due to the pain and swelling associated with her varicose veins. No ulcer.

## 2015-06-25 ENCOUNTER — Other Ambulatory Visit: Payer: Self-pay | Admitting: *Deleted

## 2015-06-25 DIAGNOSIS — I83813 Varicose veins of bilateral lower extremities with pain: Secondary | ICD-10-CM

## 2015-07-09 ENCOUNTER — Telehealth: Payer: Self-pay | Admitting: Internal Medicine

## 2015-07-09 NOTE — Telephone Encounter (Signed)
Patient states she has broke out with a rash again.  She is requesting a call back because our office is booked out for a few days.  She would like to know if this might be what she previously had or if she needs to come in.

## 2015-07-09 NOTE — Telephone Encounter (Signed)
Would recommend visit at another office.

## 2015-07-10 NOTE — Telephone Encounter (Signed)
Got patient scheduled

## 2015-07-11 ENCOUNTER — Encounter: Payer: Self-pay | Admitting: Internal Medicine

## 2015-07-11 ENCOUNTER — Ambulatory Visit (INDEPENDENT_AMBULATORY_CARE_PROVIDER_SITE_OTHER): Payer: BC Managed Care – PPO | Admitting: Internal Medicine

## 2015-07-11 VITALS — BP 128/80 | HR 85 | Temp 98.1°F | Resp 20 | Wt 266.0 lb

## 2015-07-11 DIAGNOSIS — R21 Rash and other nonspecific skin eruption: Secondary | ICD-10-CM

## 2015-07-11 MED ORDER — PREDNISONE 10 MG PO TABS: ORAL_TABLET | ORAL | Status: DC

## 2015-07-11 MED ORDER — TRIAMCINOLONE ACETONIDE 0.1 % EX CREA: 1.0000 "application " | TOPICAL_CREAM | Freq: Two times a day (BID) | CUTANEOUS | Status: DC

## 2015-07-11 NOTE — Progress Notes (Signed)
   Subjective:    Patient ID: Kylie [name], female    DOB: [birth year], 50 y.o.   MRN: [id number]  HPI  Here with c/o ongoing rash with admittedly some persistent concern about scabies, though the rash has worsened despite scabies tx, and now wondering about shingles as well.  Rash present x 1 mo, wax and wane, mild, recurrent, pruritic without pain, fever or significant swelling, located currently to forearms, mild upper mid chest, and bilat sides of abd and some to lower back area, Also has some erythema to malar areas right > left today but she had not noticed this, did not itch.  Has not tried any OTC such as antihistamines or creams, except not better to eucerin cream after a week.  Nothing seems to make worse except maybe scratching No past medical history on file. Past Surgical History  Procedure Laterality Date  . Abdominal hysterectomy    . Cesarean section      reports that she has quit smoking. She has never used smokeless tobacco. She reports that she does not drink alcohol or use illicit drugs. family history includes Diabetes in her mother; Heart disease in her paternal grandfather and paternal grandmother; Hyperlipidemia in her maternal grandfather, maternal grandmother, and mother; Hypertension in her maternal grandfather, maternal grandmother, and mother; Mental illness in her mother. No Known Allergies Current Outpatient Prescriptions on File Prior to Visit  Medication Sig Dispense Refill  . Green Tea, Camillia sinensis, (GREEN TEA PO) Take 1 tablet by mouth daily. Reported on 06/15/2015    . HYDROcodone-acetaminophen (NORCO/VICODIN) 5-325 MG per tablet Take 1 tablet by mouth every 4 (four) hours as needed. 15 tablet 0  . Multiple Vitamins-Minerals (MULTIPLE VITAMINS/WOMENS PO) Take 1 each by mouth daily. Reported on 06/15/2015    . phentermine (ADIPEX-P) 37.5 MG tablet Take 37.5 mg by mouth daily before breakfast. Reported on 06/15/2015     No current facility-administered  medications on file prior to visit.   Review of Systems All otherwise neg per pt     Objective:   Physical Exam BP 128/80 mmHg  Pulse 85  Temp(Src) 98.1 F (36.7 C) (Oral)  Resp 20  Wt 266 lb (120.657 kg)  SpO2 95% VS noted, non toxic, 2+ nervous Constitutional: Pt appears in no significant distress HENT: Head: NCAT.  Right Ear: External ear normal.  Left Ear: External ear normal.  Eyes: . Pupils are equal, round, and reactive to light. Conjunctivae and EOM are normal Neck: Normal range of motion. Neck supple.  Cardiovascular: Normal rate and regular rhythm.   Pulmonary/Chest: Effort normal and breath sounds without rales or wheezing.  Abd:  Soft, NT, ND, + BS Neurological: Pt is alert. Not confused , motor grossly intact Skin: Skin is warm. no LE edema, rash today includes right > left malar areas with faint erythema nontender/non swollen, also mid upper chest similar approx 2-3 cm area, bilat arms distal to elbows nontender erythema and ? scaliness mostly post arms to the wrists, and bilat abd/sides with slightly raised nontender erythem areas plaquelike approx 2 x 6 cm area, no grouped vesicles noted, and unable to apprec any rash to lower back though she seems to itch there  Psychiatric: Pt behavior is normal. No agitation. 2+ nervous    Assessment & Plan:

## 2015-07-11 NOTE — Patient Instructions (Signed)
You had the steroid shot today  Please take all new medication as prescribed - the prednisone, and the cream if needed  Please continue all other medications as before, and refills have been done if requested.  Please have the pharmacy call with any other refills you may need.  Please keep your appointments with your specialists as you may have planned       

## 2015-07-11 NOTE — Progress Notes (Signed)
Pre visit review using our clinic review tool, if applicable. No additional management support is needed unless otherwise documented below in the visit note. 

## 2015-07-12 NOTE — Assessment & Plan Note (Signed)
Etiology unclear, suspect allergic in nature, for depomedrol IM, predpac asd, triam cr for most itchy areas, and consider derm referral, consider further eval for SLE if not responsive to tx

## 2015-08-03 ENCOUNTER — Encounter: Payer: Self-pay | Admitting: Surgery

## 2015-08-13 ENCOUNTER — Ambulatory Visit (INDEPENDENT_AMBULATORY_CARE_PROVIDER_SITE_OTHER): Payer: BC Managed Care – PPO | Admitting: Surgery

## 2015-08-13 ENCOUNTER — Encounter: Payer: Self-pay | Admitting: Surgery

## 2015-08-13 ENCOUNTER — Ambulatory Visit (HOSPITAL_COMMUNITY)
Admission: RE | Admit: 2015-08-13 | Discharge: 2015-08-13 | Disposition: A | Discharge status: Home / Self Care | Payer: BC Managed Care – PPO | Source: Ambulatory Visit | Attending: Surgery | Admitting: Surgery

## 2015-08-13 VITALS — BP 112/80 | HR 88 | Temp 97.1°F | Resp 18 | Ht 67.5 in | Wt 264.0 lb

## 2015-08-13 DIAGNOSIS — R609 Edema, unspecified: Secondary | ICD-10-CM | POA: Diagnosis present

## 2015-08-13 DIAGNOSIS — I83813 Varicose veins of bilateral lower extremities with pain: Secondary | ICD-10-CM

## 2015-08-13 DIAGNOSIS — I8393 Asymptomatic varicose veins of bilateral lower extremities: Secondary | ICD-10-CM | POA: Diagnosis not present

## 2015-08-13 NOTE — Progress Notes (Signed)
[facility] [place]  Patient name: Kylie [name] MRN: [id number] DOB: April 28, 1966 Sex: female  REASON FOR VISIT: Varicose veins  HPI: Kylie [name] is a 50 y.o. female, who presents for evaluation of varicose veins and concerns of the circulation in her legs. The patient notes varicosities and leg pain for the past 15 years. This has worsened in the past year. 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 She complains of throbbing pain in her right leg from above the knee to mid calf. On the left, her pain is mainly at her lateral thigh. She denies any pain during the day, but has pain at night. She says that this sometimes wakes her up from her sleep. She denies any pain in her feet. She denies any swelling of her legs, claudication or nonhealing wounds.  She has no prior history of DVT. She does have a family history of varicose veins. She has elevated her legs in the past. This has somewhat improved her symptoms. She has never worn compression stockings.  The patient has no significant past medical history.  Family History  Problem Relation Age of Onset  . Hyperlipidemia Mother   . Hypertension Mother   . Mental illness Mother   . Diabetes Mother   . Hyperlipidemia Maternal Grandmother   . Hypertension Maternal Grandmother   . Hyperlipidemia Maternal Grandfather   . Hypertension Maternal Grandfather   . Heart disease Paternal Grandmother   . Heart disease Paternal Grandfather     SOCIAL HISTORY: Social History   Social History  . Marital Status: Married    Spouse Name: N/A  . Number of Children: N/A  . Years of Education: N/A   Occupational History  . Not on file.   Social History Main Topics  . Smoking status: Former Research scientist (life sciences)  . Smokeless tobacco: Never Used  . Alcohol Use: No  . Drug Use: No  . Sexual Activity: Not on file   Other Topics Concern  . Not on file   Social History Narrative    No Known Allergies  Current Outpatient  Prescriptions  Medication Sig Dispense Refill  . Green Tea, Camillia sinensis, (GREEN TEA PO) Take 1 tablet by mouth daily. Reported on 06/15/2015    . HYDROcodone-acetaminophen (NORCO/VICODIN) 5-325 MG per tablet Take 1 tablet by mouth every 4 (four) hours as needed. 15 tablet 0  . Multiple Vitamins-Minerals (MULTIPLE VITAMINS/WOMENS PO) Take 1 each by mouth daily. Reported on 06/15/2015    . phentermine (ADIPEX-P) 37.5 MG tablet Take 37.5 mg by mouth daily before breakfast. Reported on 06/15/2015    . predniSONE (DELTASONE) 10 MG tablet 3 tabs by mouth per day for 3 days,2tabs per day for 3 days,1tab per day for 3 days 18 tablet 0  . triamcinolone cream (KENALOG) 0.1 % Apply 1 application topically 2 (two) times daily. 30 g 1   No current facility-administered medications for this visit.    REVIEW OF SYSTEMS:  [X]  denotes positive finding, [ ]  denotes negative finding Cardiac  Comments:  Chest pain or chest pressure:    Shortness of breath upon exertion:    Short of breath when lying flat:    Irregular heart rhythm:        Vascular    Pain in calf, thigh, or hip brought on by ambulation:    Pain in feet at night that wakes you up from your sleep:  x   Blood clot in your veins:  Leg swelling:         Pulmonary    Oxygen at home:    Productive cough:     Wheezing:         Neurologic    Sudden weakness in arms or legs:     Sudden numbness in arms or legs:     Sudden onset of difficulty speaking or slurred speech:    Temporary loss of vision in one eye:     Problems with dizziness:         Gastrointestinal    Blood in stool:     Vomited blood:         Genitourinary    Burning when urinating:     Blood in urine:        Psychiatric    Major depression:         Hematologic    Bleeding problems:    Problems with blood clotting too easily:        Skin    Rashes or ulcers:        Constitutional    Fever or chills:      PHYSICAL EXAM: Filed Vitals:   08/13/15 1452    BP: 112/80  Pulse: 88  Temp: 97.1 F (36.2 C)  TempSrc: Oral  Resp: 18  Height: 5' 7.5" (1.715 m)  Weight: 264 lb (119.75 kg)  SpO2: 99%    GENERAL: The patient is a well-nourished, obese female, in no acute distress. The vital signs are documented above. CARDIAC: There is a regular rate and rhythm. No carotid bruits VASCULAR: 2+ radial pulses bilaterally. Nonpalpable popliteal pulses. 2+ dorsalis pedis pulses bilaterally. PULMONARY: There is good air exchange bilaterally without wheezing or rales. ABDOMEN: Soft and non-tender with normal pitched bowel sounds.  MUSCULOSKELETAL: Spider veins bilaterally. No significant tortuous varicosities. NEUROLOGIC: No focal weakness or paresthesias are detected. SKIN: No venous stasis changes. No ulcers seen. PSYCHIATRIC: The patient has a normal affect.  DATA:  Venous reflux evaluation 08/13/2015  No evidence of DVT bilaterally.  Reflux at the saphenofemoral junction bilaterally. Some deep vein reflux on the right. No great saphenous or small saphenous reflux present bilaterally.  MEDICAL ISSUES:  Varicose veins and leg pain, right worse than left  The patient has no evidence of great saphenous reflux bilaterally. She does have some deep venous reflux on the right. Discussed that there is no intervention for deep venous reflux. Recommend conservative therapy. Advised the patient to wear compression stockings while at work since she is standing all day. Also advised elevation of her legs when she is not standing and the use of ibuprofen for pain. The patient has adequate arterial function as demonstrated by her pedal pulse exam. The patient will follow up on an as-needed basis.   Virgina Jock, PA-C [facility]   I agree with the above.  I have seen and evaluated the patient.  She comes in today with pain at night particularly on the left which is in the region of a spider vein cluster.  Venous  duplex ultrasound was performed today which I reviewed which does not show any significant superficial venous reflux on the right or left.  There is reflux in the right common femoral and popliteal vein.  The spider veins that she is concerned most about her on the left side the level of the knee.  I told the patient that she should try compression stockings to see if this helps alleviate her  pain.  If she would like to pursue treatment of her spider veins, she would be a candidate for sclerotherapy.  I have given her the number to contact us if she wants to get this done  Annamarie Major

## 2015-11-13 ENCOUNTER — Encounter: Payer: Self-pay | Admitting: Gastroenterology

## 2015-11-15 ENCOUNTER — Ambulatory Visit (INDEPENDENT_AMBULATORY_CARE_PROVIDER_SITE_OTHER): Payer: BC Managed Care – PPO | Admitting: Internal Medicine

## 2015-11-15 ENCOUNTER — Encounter: Payer: Self-pay | Admitting: Internal Medicine

## 2015-11-15 MED ORDER — PHENTERMINE HCL 37.5 MG PO TABS: 37.5000 mg | ORAL_TABLET | Freq: Every day | ORAL | Status: DC

## 2015-11-15 NOTE — Patient Instructions (Signed)
We have given you the phentermine prescription to help start the weight loss and energy. Take 1 pill daily.   Serving Sizes A serving size is a measured amount of food or drink, such as one slice of bread, that has an associated nutrient content. Knowing the serving size of a food or drink can help you determine how much of that food you should consume.  WHAT IS THE SIZE OF ONE SERVING? The size of one healthy serving depends on the food or drink. To determine a serving size, read the food label. If the food or drink does not have a food label, try to find serving size information online. Or, use the following to estimate the size of one adult serving:  Grain 1 slice bread.  bagel.  cup pasta.  Vegetable  cup cooked or canned vegetables. 1 cup raw, leafy greens.  Fruit  cup canned fruit. 1 medium fruit.  cup dried fruit.  Meat and Other Protein Sources 1 oz meat, poultry, or fish.  cup cooked beans. 1 egg.  cup nuts or seeds. 1 Tbsp nut butter.  cup tofu or tempeh. 2 Tbsp hummus.  Dairy An individual container of yogurt (6-8 oz). 1 piece of cheese the size of your thumb (1 oz). 1 cup (8 oz) milk or milk alternative.  Fat A piece the size of one dice. 1 tsp soft margarine. 1 Tbsp mayonnaise. 1 tsp vegetable oil. 1 Tbsp regular salad dressing. 2 Tbsp low-fat salad dressing.  HOW MANY SERVINGS SHOULD I EAT FROM EACH FOOD GROUP EACH DAY?  The following are the suggested number of servings to try and have every day from each food group. You can also look at your eating throughout the week and aim for meeting these requirements on most days for overall healthy eating.  Grain 6-8 servings. Try to have half of your grains from whole grains, such as whole wheat bread, corn tortillas, oatmeal, brown rice, whole wheat pasta, and bulgur. Vegetable At least 2-3 servings.  Fruit 2 servings.  Meat and Other Protein Foods 5-6 servings. Aim to have lean proteins, such as chicken, [place], fish,  beans, or tofu. Dairy 3 servings. Choose low-fat or nonfat if you are trying to control your weight.  Fat 2-3 servings.  IS A SERVING THE SAME THING AS A PORTION? No. A portion is the actual amount you eat, which may be more than one serving. Knowing the specific serving size of a food and the nutritional information that goes with it can help you make a healthy decision on what size portion to eat.  WHAT ARE SOME TIPS TO HELP ME LEARN HEALTHY SERVING SIZES?  Check food labels for serving sizes. Many foods that come as a single portion actually contain multiple servings.  Determine the serving size of foods you commonly eat and figure out how large a portion you usually eat.  Measure the number of servings that can be held by the bowls, glasses, cups, and plates you typically use. For example, pour your breakfast cereal into your regular bowl and then pour it into a measuring cup.  For 1-2 days, measure the serving sizes of all the foods you eat.  Practice estimating serving sizes and determining how big your portions should be.   This information is not intended to replace advice given to you by your health care provider. Make sure you discuss any questions you have with your health care provider.   Document Released: 01/11/2003 Document Revised: 05/05/2014 Document  Reviewed: 07/12/2013 Elsevier Interactive Patient Education [employer].

## 2015-11-15 NOTE — Progress Notes (Signed)
   Subjective:    Patient ID: Kylie [name], female    DOB: February 13, 1966, 50 y.o.   MRN: [id number]  HPI The patient is a 50 YO female coming in for discussion about weight loss. She has tried phentermine in the past with some success. She has had a lot of stress in the last several months which has caused her weight to increase as well as a round of prednisone for breakout. She is considering weight loss surgery and is not sure about her options. She has tried to exercise but feels like she is drained afterwards and cannot do much else the whole day.   Review of Systems  Constitutional: Negative for fever, activity change, appetite change, fatigue and unexpected weight change.  Respiratory: Negative for cough, chest tightness, shortness of breath and wheezing.   Cardiovascular: Negative for chest pain, palpitations and leg swelling.  Gastrointestinal: Negative for nausea, abdominal pain, diarrhea, constipation and abdominal distention.  Musculoskeletal: Positive for myalgias and arthralgias.  Skin: Negative.   Neurological: Negative.   Psychiatric/Behavioral: Negative.       Objective:   Physical Exam  Constitutional: She appears well-developed and well-nourished.  Overweight  HENT:  Head: Normocephalic and atraumatic.  Eyes: EOM are normal.  Neck: Normal range of motion.  Cardiovascular: Normal rate and regular rhythm.   Pulmonary/Chest: Effort normal and breath sounds normal. No respiratory distress. She has no wheezes. She has no rales.  Abdominal: Soft. She exhibits no distension. There is no tenderness. There is no rebound.  Musculoskeletal: She exhibits no edema.  Neurological: Coordination normal.  Skin: Skin is warm and dry.  Psychiatric: She has a normal mood and affect.   Filed Vitals:   11/15/15 1540  BP: 120/78  Pulse: 97  Temp: 98.9 F (37.2 C)  TempSrc: Oral  Resp: 12  Height: 5' 7.5" (1.715 m)  Weight: 278 lb (126.1 kg)  SpO2: 97%      Assessment & Plan:

## 2015-11-15 NOTE — Progress Notes (Signed)
Pre visit review using our clinic review tool, if applicable. No additional management support is needed unless otherwise documented below in the visit note. 

## 2015-11-15 NOTE — Assessment & Plan Note (Signed)
Rx for phentermine to assist with weight loss and given information about the bariatric seminar to get started on weight loss surgery. She is interested and not sure but willing to pursue more information. Talked to her about the basics of the surgeries.

## 2015-12-13 ENCOUNTER — Ambulatory Visit: Payer: BC Managed Care – PPO | Admitting: Internal Medicine

## 2016-01-14 ENCOUNTER — Telehealth: Payer: Self-pay | Admitting: *Deleted

## 2016-01-14 NOTE — Telephone Encounter (Signed)
Called patient x 2 today. No answer, unable to leave message because "voice mailbox is full". Mailed no show letter today. IF patient is on weight loss medications phentermine then she needs to stop taking this 10 days prior to colonoscopy appointment.

## 2016-01-22 ENCOUNTER — Encounter: Payer: BC Managed Care – PPO | Admitting: Gastroenterology

## 2016-02-14 ENCOUNTER — Ambulatory Visit: Payer: BC Managed Care – PPO | Admitting: Internal Medicine

## 2016-02-21 ENCOUNTER — Ambulatory Visit: Payer: BC Managed Care – PPO | Admitting: Internal Medicine

## 2016-07-28 ENCOUNTER — Other Ambulatory Visit: Payer: Self-pay | Admitting: Internal Medicine

## 2016-07-28 DIAGNOSIS — Z1231 Encounter for screening mammogram for malignant neoplasm of breast: Secondary | ICD-10-CM

## 2016-07-30 ENCOUNTER — Ambulatory Visit
Admission: RE | Admit: 2016-07-30 | Discharge: 2016-07-30 | Disposition: A | Discharge status: Home / Self Care | Payer: BC Managed Care – PPO | Source: Ambulatory Visit | Attending: Internal Medicine | Admitting: Internal Medicine

## 2016-07-30 ENCOUNTER — Encounter: Payer: Self-pay | Admitting: Internal Medicine

## 2016-07-30 ENCOUNTER — Other Ambulatory Visit (INDEPENDENT_AMBULATORY_CARE_PROVIDER_SITE_OTHER): Payer: BC Managed Care – PPO

## 2016-07-30 ENCOUNTER — Ambulatory Visit (INDEPENDENT_AMBULATORY_CARE_PROVIDER_SITE_OTHER): Payer: BC Managed Care – PPO | Admitting: Internal Medicine

## 2016-07-30 VITALS — BP 130/76 | HR 86 | Temp 98.2°F | Resp 14 | Ht 67.5 in | Wt 270.0 lb

## 2016-07-30 DIAGNOSIS — Z0000 Encounter for general adult medical examination without abnormal findings: Secondary | ICD-10-CM | POA: Diagnosis not present

## 2016-07-30 DIAGNOSIS — Z1231 Encounter for screening mammogram for malignant neoplasm of breast: Secondary | ICD-10-CM

## 2016-07-30 LAB — COMPREHENSIVE METABOLIC PANEL
ALK PHOS: 60 U/L (ref 39–117)
ALT: 20 U/L (ref 0–35)
AST: 24 U/L (ref 0–37)
Albumin: 4 g/dL (ref 3.5–5.2)
BILIRUBIN TOTAL: 0.4 mg/dL (ref 0.2–1.2)
BUN: 9 mg/dL (ref 6–23)
CO2: 29 mEq/L (ref 19–32)
Calcium: 9.1 mg/dL (ref 8.4–10.5)
Chloride: 105 mEq/L (ref 96–112)
Creatinine, Ser: 0.86 mg/dL (ref 0.40–1.20)
GFR: 89.15 mL/min (ref >60.00)
GLUCOSE: 68 mg/dL — AB (ref 70–99)
Potassium: 4.3 mEq/L (ref 3.5–5.1)
SODIUM: 138 meq/L (ref 135–145)
TOTAL PROTEIN: 7.1 g/dL (ref 6.0–8.3)

## 2016-07-30 LAB — LIPID PANEL
Cholesterol: 164 mg/dL (ref 0–200)
HDL: 58.6 mg/dL (ref >39.00)
LDL Cholesterol: 92 mg/dL (ref 0–99)
NONHDL: 105.88
Total CHOL/HDL Ratio: 3
Triglycerides: 71 mg/dL (ref 0.0–149.0)
VLDL: 14.2 mg/dL (ref 0.0–40.0)

## 2016-07-30 LAB — CBC
HCT: 40.1 % (ref 36.0–46.0)
HEMOGLOBIN: 12.9 g/dL (ref 12.0–15.0)
MCHC: 32.2 g/dL (ref 30.0–36.0)
MCV: 86.5 fl (ref 78.0–100.0)
PLATELETS: 256 10*3/uL (ref 150.0–400.0)
RBC: 4.63 Mil/uL (ref 3.87–5.11)
RDW: 14.9 % (ref 11.5–15.5)
WBC: 5.2 10*3/uL (ref 4.0–10.5)

## 2016-07-30 LAB — HEMOGLOBIN A1C: HEMOGLOBIN A1C: 5.8 % (ref 4.6–6.5)

## 2016-07-30 MED ORDER — NYSTATIN-TRIAMCINOLONE 100000-0.1 UNIT/GM-% EX OINT: 1.0000 "application " | TOPICAL_OINTMENT | Freq: Two times a day (BID) | CUTANEOUS | 6 refills | Status: DC

## 2016-07-30 MED ORDER — PHENTERMINE HCL 37.5 MG PO TABS: 37.5000 mg | ORAL_TABLET | Freq: Every day | ORAL | 3 refills | Status: DC

## 2016-07-30 NOTE — Progress Notes (Signed)
Pre visit review using our clinic review tool, if applicable. No additional management support is needed unless otherwise documented below in the visit note. 

## 2016-07-30 NOTE — Patient Instructions (Addendum)
Biotin is a vitamin which might help with the skin.   Kylie [name] is a vitamin which sometimes helps with hot flashes.   We have given you the prescription for the phentermine.   Health Maintenance, Female Adopting a healthy lifestyle and getting preventive care can go a long way to promote health and wellness. Talk with your health care provider about what schedule of regular examinations is right for you. This is a good chance for you to check in with your provider about disease prevention and staying healthy. In between checkups, there are plenty of things you can do on your own. Experts have done a lot of research about which lifestyle changes and preventive measures are most likely to keep you healthy. Ask your health care provider for more information. Weight and diet Eat a healthy diet  Be sure to include plenty of vegetables, fruits, low-fat dairy products, and lean protein.  Do not eat a lot of foods high in solid fats, added sugars, or salt.  Get regular exercise. This is one of the most important things you can do for your health.  Most adults should exercise for at least 150 minutes each week. The exercise should increase your heart rate and make you sweat (moderate-intensity exercise).  Most adults should also do strengthening exercises at least twice a week. This is in addition to the moderate-intensity exercise. Maintain a healthy weight  Body mass index (BMI) is a measurement that can be used to identify possible weight problems. It estimates body fat based on height and weight. Your health care provider can help determine your BMI and help you achieve or maintain a healthy weight.  For females 28 years of age and older:  A BMI below 18.5 is considered underweight.  A BMI of 18.5 to 24.9 is normal.  A BMI of 25 to 29.9 is considered overweight.  A BMI of 30 and above is considered obese. Watch levels of cholesterol and blood lipids  You should start having your  blood tested for lipids and cholesterol at 52 years of age, then have this test every 5 years.  You may need to have your cholesterol levels checked more often if:  Your lipid or cholesterol levels are high.  You are older than 52 years of age.  You are at high risk for heart disease. Cancer screening Lung Cancer  Lung cancer screening is recommended for adults 52-19 years old who are at high risk for lung cancer because of a history of smoking.  A yearly low-dose CT scan of the lungs is recommended for people who:  Currently smoke.  Have quit within the past 15 years.  Have at least a 30-pack-year history of smoking. A pack year is smoking an average of one pack of cigarettes a day for 1 year.  Yearly screening should continue until it has been 15 years since you quit.  Yearly screening should stop if you develop a health problem that would prevent you from having lung cancer treatment. Breast Cancer  Practice breast self-awareness. This means understanding how your breasts normally appear and feel.  It also means doing regular breast self-exams. Let your health care provider know about any changes, no matter how small.  If you are in your 20s or 30s, you should have a clinical breast exam (CBE) by a health care provider every 1-3 years as part of a regular health exam.  If you are 52 or older, have a CBE every year. Also consider having  a breast X-ray (mammogram) every year.  If you have a family history of breast cancer, talk to your health care provider about genetic screening.  If you are at high risk for breast cancer, talk to your health care provider about having an MRI and a mammogram every year.  Breast cancer gene (BRCA) assessment is recommended for women who have family members with BRCA-related cancers. BRCA-related cancers include:  Breast.  Ovarian.  Tubal.  Peritoneal cancers.  Results of the assessment will determine the need for genetic counseling  and BRCA1 and BRCA2 testing. Cervical Cancer  Your health care provider may recommend that you be screened regularly for cancer of the pelvic organs (ovaries, uterus, and vagina). This screening involves a pelvic examination, including checking for microscopic changes to the surface of your cervix (Pap test). You may be encouraged to have this screening done every 3 years, beginning at age 52.  For women ages 7-65, health care providers may recommend pelvic exams and Pap testing every 3 years, or they may recommend the Pap and pelvic exam, combined with testing for human papilloma virus (HPV), every 5 years. Some types of HPV increase your risk of cervical cancer. Testing for HPV may also be done on women of any age with unclear Pap test results.  Other health care providers may not recommend any screening for nonpregnant women who are considered low risk for pelvic cancer and who do not have symptoms. Ask your health care provider if a screening pelvic exam is right for you.  If you have had past treatment for cervical cancer or a condition that could lead to cancer, you need Pap tests and screening for cancer for at least 20 years after your treatment. If Pap tests have been discontinued, your risk factors (such as having a new sexual partner) need to be reassessed to determine if screening should resume. Some women have medical problems that increase the chance of getting cervical cancer. In these cases, your health care provider may recommend more frequent screening and Pap tests. Colorectal Cancer  This type of cancer can be detected and often prevented.  Routine colorectal cancer screening usually begins at 52 years of age and continues through 52 years of age.  Your health care provider may recommend screening at an earlier age if you have risk factors for colon cancer.  Your health care provider may also recommend using home test kits to check for hidden blood in the stool.  A small  camera at the end of a tube can be used to examine your colon directly (sigmoidoscopy or colonoscopy). This is done to check for the earliest forms of colorectal cancer.  Routine screening usually begins at age 52.  Direct examination of the colon should be repeated every 5-10 years through 52 years of age. However, you may need to be screened more often if early forms of precancerous polyps or small growths are found. Skin Cancer  Check your skin from head to toe regularly.  Tell your health care provider about any new moles or changes in moles, especially if there is a change in a mole's shape or color.  Also tell your health care provider if you have a mole that is larger than the size of a pencil eraser.  Always use sunscreen. Apply sunscreen liberally and repeatedly throughout the day.  Protect yourself by wearing long sleeves, pants, a wide-brimmed hat, and sunglasses whenever you are outside. Heart disease, diabetes, and high blood pressure  High blood pressure  causes heart disease and increases the risk of stroke. High blood pressure is more likely to develop in:  People who have blood pressure in the high end of the normal range (130-139/85-89 mm Hg).  People who are overweight or obese.  People who are African American.  If you are 23-25 years of age, have your blood pressure checked every 3-5 years. If you are 58 years of age or older, have your blood pressure checked every year. You should have your blood pressure measured twice-once when you are at a hospital or clinic, and once when you are not at a hospital or clinic. Record the average of the two measurements. To check your blood pressure when you are not at a hospital or clinic, you can use:  An automated blood pressure machine at a pharmacy.  A home blood pressure monitor.  If you are between 46 years and 46 years old, ask your health care provider if you should take aspirin to prevent strokes.  Have regular  diabetes screenings. This involves taking a blood sample to check your fasting blood sugar level.  If you are at a normal weight and have a low risk for diabetes, have this test once every three years after 52 years of age.  If you are overweight and have a high risk for diabetes, consider being tested at a younger age or more often. Preventing infection Hepatitis B  If you have a higher risk for hepatitis B, you should be screened for this virus. You are considered at high risk for hepatitis B if:  You were born in a country where hepatitis B is common. Ask your health care provider which countries are considered high risk.  Your parents were born in a high-risk country, and you have not been immunized against hepatitis B (hepatitis B vaccine).  You have HIV or AIDS.  You use needles to inject street drugs.  You live with someone who has hepatitis B.  You have had sex with someone who has hepatitis B.  You get hemodialysis treatment.  You take certain medicines for conditions, including cancer, organ transplantation, and autoimmune conditions. Hepatitis C  Blood testing is recommended for:  Everyone born from [birth year] through 1966.  Anyone with known risk factors for hepatitis C. Sexually transmitted infections (STIs)  You should be screened for sexually transmitted infections (STIs) including gonorrhea and chlamydia if:  You are sexually active and are younger than 52 years of age.  You are older than 52 years of age and your health care provider tells you that you are at risk for this type of infection.  Your sexual activity has changed since you were last screened and you are at an increased risk for chlamydia or gonorrhea. Ask your health care provider if you are at risk.  If you do not have HIV, but are at risk, it may be recommended that you take a prescription medicine daily to prevent HIV infection. This is called pre-exposure prophylaxis (PrEP). You are considered at  risk if:  You are sexually active and do not regularly use condoms or know the HIV status of your partner(s).  You take drugs by injection.  You are sexually active with a partner who has HIV. Talk with your health care provider about whether you are at high risk of being infected with HIV. If you choose to begin PrEP, you should first be tested for HIV. You should then be tested every 3 months for as long as you are  taking PrEP. Pregnancy  If you are premenopausal and you may become pregnant, ask your health care provider about preconception counseling.  If you may become pregnant, take 400 to 800 micrograms (mcg) of folic acid every day.  If you want to prevent pregnancy, talk to your health care provider about birth control (contraception). Osteoporosis and menopause  Osteoporosis is a disease in which the bones lose minerals and strength with aging. This can result in serious bone fractures. Your risk for osteoporosis can be identified using a bone density scan.  If you are 36 years of age or older, or if you are at risk for osteoporosis and fractures, ask your health care provider if you should be screened.  Ask your health care provider whether you should take a calcium or vitamin D supplement to lower your risk for osteoporosis.  Menopause may have certain physical symptoms and risks.  Hormone replacement therapy may reduce some of these symptoms and risks. Talk to your health care provider about whether hormone replacement therapy is right for you. Follow these instructions at home:  Schedule regular health, dental, and eye exams.  Stay current with your immunizations.  Do not use any tobacco products including cigarettes, chewing tobacco, or electronic cigarettes.  If you are pregnant, do not drink alcohol.  If you are breastfeeding, limit how much and how often you drink alcohol.  Limit alcohol intake to no more than 1 drink per day for nonpregnant women. One drink  equals 12 ounces of beer, 5 ounces of wine, or 1 ounces of hard liquor.  Do not use street drugs.  Do not share needles.  Ask your health care provider for help if you need support or information about quitting drugs.  Tell your health care provider if you often feel depressed.  Tell your health care provider if you have ever been abused or do not feel safe at home. This information is not intended to replace advice given to you by your health care provider. Make sure you discuss any questions you have with your health care provider. Document Released: 10/28/2010 Document Revised: 09/20/2015 Document Reviewed: 01/16/2015 Elsevier Interactive Patient Education  2017 [employer].

## 2016-07-30 NOTE — Progress Notes (Signed)
   Subjective:    Patient ID: Kylie [name], female    DOB: [birth year], 51 y.o.   MRN: [id number]  HPI The patient is a 51 YO female coming in for wellness. No new concerns. Down about 7 pounds since last visit.  PMH, [facility], social history reviewed and updated.   Review of Systems  Constitutional: Negative.   HENT: Negative.   Eyes: Negative.   Respiratory: Negative for cough, chest tightness and shortness of breath.   Cardiovascular: Negative for chest pain, palpitations and leg swelling.  Gastrointestinal: Negative for abdominal distention, abdominal pain, constipation, diarrhea, nausea and vomiting.  Musculoskeletal: Negative.   Skin: Negative.   Neurological: Negative.   Psychiatric/Behavioral: Negative.       Objective:   Physical Exam  Constitutional: She is oriented to person, place, and time. She appears well-developed and well-nourished.  HENT:  Head: Normocephalic and atraumatic.  Eyes: EOM are normal.  Neck: Normal range of motion.  Cardiovascular: Normal rate and regular rhythm.   Pulmonary/Chest: Effort normal and breath sounds normal. No respiratory distress. She has no wheezes. She has no rales.  Abdominal: Soft. Bowel sounds are normal. She exhibits no distension. There is no tenderness. There is no rebound.  Musculoskeletal: She exhibits no edema.  Neurological: She is alert and oriented to person, place, and time. Coordination normal.  Skin: Skin is warm and dry.  Psychiatric: She has a normal mood and affect.   Vitals:   07/30/16 1055  BP: 130/76  Pulse: 86  Resp: 14  Temp: 98.2 F (36.8 C)  TempSrc: Oral  SpO2: 98%  Weight: 270 lb (122.5 kg)  Height: 5' 7.5" (1.715 m)      Assessment & Plan:

## 2016-08-01 ENCOUNTER — Encounter: Payer: Self-pay | Admitting: Internal Medicine

## 2016-08-01 DIAGNOSIS — Z0000 Encounter for general adult medical examination without abnormal findings: Secondary | ICD-10-CM | POA: Insufficient documentation

## 2016-08-01 NOTE — Assessment & Plan Note (Signed)
Wants to do phentermine again for 3 more months to help with weight loss and will do with visit in 3 months.

## 2016-08-01 NOTE — Assessment & Plan Note (Signed)
Needs colonoscopy and she will call about that, checking labs, declines any immunizations today. Mammogram up to date. Counseled about the need for exercise and diet changes to help with her goal of weight loss. Given screening recommendations.

## 2017-04-10 ENCOUNTER — Other Ambulatory Visit: Payer: Self-pay | Admitting: Internal Medicine

## 2017-05-19 ENCOUNTER — Ambulatory Visit: Payer: BC Managed Care – PPO | Admitting: Internal Medicine

## 2017-05-19 ENCOUNTER — Encounter: Payer: Self-pay | Admitting: Internal Medicine

## 2017-05-19 MED ORDER — PHENTERMINE HCL 37.5 MG PO TABS: 37.5000 mg | ORAL_TABLET | Freq: Every day | ORAL | 3 refills | Status: DC

## 2017-05-19 NOTE — Patient Instructions (Signed)
We will send in the phentermine to the pharmacy.

## 2017-05-19 NOTE — Progress Notes (Signed)
   Subjective:    Patient ID: Kylie [name], female    DOB: [birth year], 52 y.o.   MRN: [id number]  HPI The patient is a 52 YO female coming in for follow up of her morbid obesity. She would like to do another 3 months of phentermine. She has had a family member diet after weight loss procedure and this has scared her a lot. She wants to seriously work on weight loss but does not want any kind of procedure. Does have some emotional overeating and has a hard time with exercise due to past ankle fracture and ongoing right ankle pain.   Review of Systems  Constitutional: Negative.   HENT: Negative.   Eyes: Negative.   Respiratory: Negative for cough, chest tightness and shortness of breath.   Cardiovascular: Negative for chest pain, palpitations and leg swelling.  Gastrointestinal: Negative for abdominal distention, abdominal pain, constipation, diarrhea, nausea and vomiting.  Musculoskeletal: Negative.   Skin: Negative.   Neurological: Negative.   Psychiatric/Behavioral: Negative.       Objective:   Physical Exam  Constitutional: She is oriented to person, place, and time. She appears well-developed and well-nourished.  HENT:  Head: Normocephalic and atraumatic.  Eyes: EOM are normal.  Neck: Normal range of motion.  Cardiovascular: Normal rate and regular rhythm.  Pulmonary/Chest: Effort normal and breath sounds normal. No respiratory distress. She has no wheezes. She has no rales.  Abdominal: Soft. Bowel sounds are normal. She exhibits no distension. There is no tenderness. There is no rebound.  Musculoskeletal: She exhibits no edema.  Neurological: She is alert and oriented to person, place, and time. Coordination normal.  Skin: Skin is warm and dry.  Psychiatric: She has a normal mood and affect.   Vitals:   05/19/17 1332  BP: 124/78  Pulse: 72  Temp: 97.7 F (36.5 C)  TempSrc: Oral  SpO2: 100%  Weight: 275 lb (124.7 kg)  Height: 5' 7.5" (1.715 m)      Assessment &  Plan:

## 2017-05-19 NOTE — Assessment & Plan Note (Signed)
Rx for phentermine and needs visit in 3 months to assess progress. She does not want to pursue weight loss procedure given recent death of family member post surgery for weight loss.

## 2017-07-31 ENCOUNTER — Encounter: Payer: BC Managed Care – PPO | Admitting: Internal Medicine

## 2017-08-07 ENCOUNTER — Encounter: Payer: BC Managed Care – PPO | Admitting: Internal Medicine

## 2017-10-16 ENCOUNTER — Other Ambulatory Visit: Payer: Self-pay | Admitting: Internal Medicine

## 2017-10-16 DIAGNOSIS — Z1231 Encounter for screening mammogram for malignant neoplasm of breast: Secondary | ICD-10-CM

## 2017-10-19 ENCOUNTER — Ambulatory Visit
Admission: RE | Admit: 2017-10-19 | Discharge: 2017-10-19 | Disposition: A | Discharge status: Home / Self Care | Payer: BC Managed Care – PPO | Source: Ambulatory Visit | Attending: Internal Medicine | Admitting: Internal Medicine

## 2017-10-19 DIAGNOSIS — Z1231 Encounter for screening mammogram for malignant neoplasm of breast: Secondary | ICD-10-CM

## 2017-11-13 ENCOUNTER — Other Ambulatory Visit: Payer: Self-pay | Admitting: Internal Medicine

## 2018-04-12 ENCOUNTER — Encounter: Payer: Self-pay | Admitting: Gastroenterology

## 2018-04-23 ENCOUNTER — Ambulatory Visit (AMBULATORY_SURGERY_CENTER): Payer: Self-pay | Admitting: *Deleted

## 2018-04-23 VITALS — Ht 67.5 in | Wt 272.0 lb

## 2018-04-23 DIAGNOSIS — Z1211 Encounter for screening for malignant neoplasm of colon: Secondary | ICD-10-CM

## 2018-04-23 MED ORDER — NA SULFATE-K SULFATE-MG SULF 17.5-3.13-1.6 GM/177ML PO SOLN: ORAL | 0 refills | Status: DC

## 2018-04-23 NOTE — Progress Notes (Signed)
Patient denies any allergies to eggs or soy. Patient denies any problems with anesthesia/sedation. Patient denies any oxygen use at home. Patient denies taking any blood thinners. Patient is aware to stop Phentermine 10 days prior to exam. EMMI education offered, pt declined. Suprep $15 off coupon given to pt.

## 2018-04-30 ENCOUNTER — Encounter: Payer: Self-pay | Admitting: Gastroenterology

## 2018-05-11 ENCOUNTER — Ambulatory Visit (AMBULATORY_SURGERY_CENTER): Payer: BC Managed Care – PPO | Admitting: Gastroenterology

## 2018-05-11 ENCOUNTER — Encounter: Payer: Self-pay | Admitting: Gastroenterology

## 2018-05-11 VITALS — BP 115/66 | HR 61 | Temp 97.5°F | Resp 17 | Ht 67.0 in | Wt 275.0 lb

## 2018-05-11 DIAGNOSIS — Z1211 Encounter for screening for malignant neoplasm of colon: Secondary | ICD-10-CM

## 2018-05-11 DIAGNOSIS — K635 Polyp of colon: Secondary | ICD-10-CM

## 2018-05-11 DIAGNOSIS — D120 Benign neoplasm of cecum: Secondary | ICD-10-CM

## 2018-05-11 DIAGNOSIS — D123 Benign neoplasm of transverse colon: Secondary | ICD-10-CM

## 2018-05-11 MED ORDER — SODIUM CHLORIDE 0.9 % IV SOLN: 500.0000 mL | Freq: Once | INTRAVENOUS | Status: DC

## 2018-05-11 NOTE — Progress Notes (Signed)
Called to room to assist during endoscopic procedure.  Patient ID and intended procedure confirmed with present staff. Received instructions for my participation in the procedure from the performing physician.  

## 2018-05-11 NOTE — Progress Notes (Signed)
Pt's states no medical or surgical changes since previsit or office visit. 

## 2018-05-11 NOTE — Patient Instructions (Signed)
YOU HAD AN ENDOSCOPIC PROCEDURE TODAY AT THE [facility]:   Refer to the procedure report that was given to you for any specific questions about what was found during the examination.  If the procedure report does not answer your questions, please call your gastroenterologist to clarify.  If you requested that your care partner not be given the details of your procedure findings, then the procedure report has been included in a sealed envelope for you to review at your convenience later.  YOU SHOULD EXPECT: Some feelings of bloating in the abdomen. Passage of more gas than usual.  Walking can help get rid of the air that was put into your GI tract during the procedure and reduce the bloating. If you had a lower endoscopy (such as a colonoscopy or flexible sigmoidoscopy) you may notice spotting of blood in your stool or on the toilet paper. If you underwent a bowel prep for your procedure, you may not have a normal bowel movement for a few days.  Please Note:  You might notice some irritation and congestion in your nose or some drainage.  This is from the oxygen used during your procedure.  There is no need for concern and it should clear up in a day or so.  SYMPTOMS TO REPORT IMMEDIATELY:   Following lower endoscopy (colonoscopy or flexible sigmoidoscopy):  Excessive amounts of blood in the stool  Significant tenderness or worsening of abdominal pains  Swelling of the abdomen that is new, acute  Fever of 100F or higher  For urgent or emergent issues, a gastroenterologist can be reached at any hour by calling [phone].   DIET:  We do recommend a small meal at first, but then you may proceed to your regular diet.  Drink plenty of fluids but you should avoid alcoholic beverages for 24 hours.  MEDICATIONS: Continue present medications.  Please see handouts given to you by your recovery nurse.  ACTIVITY:  You should plan to take it easy for the rest of today and you should NOT  DRIVE or use heavy machinery until tomorrow (because of the sedation medicines used during the test).    FOLLOW UP: Our staff will call the number listed on your records the next business day following your procedure to check on you and address any questions or concerns that you may have regarding the information given to you following your procedure. If we do not reach you, we will leave a message.  However, if you are feeling well and you are not experiencing any problems, there is no need to return our call.  We will assume that you have returned to your regular daily activities without incident.  If any biopsies were taken you will be contacted by phone or by letter within the next 1-3 weeks.  Please call us at [phone] if you have not heard about the biopsies in 3 weeks.   Thank you for allowing us to provide for your healthcare needs today.   SIGNATURES/CONFIDENTIALITY: You and/or your care partner have signed paperwork which will be entered into your electronic medical record.  These signatures attest to the fact that that the information above on your After Visit Summary has been reviewed and is understood.  Full responsibility of the confidentiality of this discharge information lies with you and/or your care-partner. 

## 2018-05-11 NOTE — Progress Notes (Signed)
Report to PACU, RN, vss, BBS= Clear.  

## 2018-05-11 NOTE — Op Note (Signed)
[place] Patient Name: Kylie [name] Procedure Date: 05/11/2018 8:31 AM MRN: [id number] Endoscopist: Remo Lipps P. Havery Moros , MD Age: 53 Referring MD:  Date of Birth: [birth year] Gender: Female Account #: [id number] Procedure:                Colonoscopy Indications:              Screening for colorectal malignant neoplasm, This                            is the patient's first colonoscopy Medicines:                Monitored Anesthesia Care Procedure:                Pre-Anesthesia Assessment:                           - Prior to the procedure, a History and Physical                            was performed, and patient medications and                            allergies were reviewed. The patient's tolerance of                            previous anesthesia was also reviewed. The risks                            and benefits of the procedure and the sedation                            options and risks were discussed with the patient.                            All questions were answered, and informed consent                            was obtained. Prior Anticoagulants: The patient has                            taken no previous anticoagulant or antiplatelet                            agents. ASA Grade Assessment: III - A patient with                            severe systemic disease. After reviewing the risks                            and benefits, the patient was deemed in                            satisfactory condition to undergo the procedure.  After obtaining informed consent, the colonoscope                            was passed under direct vision. Throughout the                            procedure, the patient's blood pressure, pulse, and                            oxygen saturations were monitored continuously. The                            Colonoscope was introduced through the anus and                            advanced to the  the cecum, identified by                            appendiceal orifice and ileocecal valve. The                            colonoscopy was performed without difficulty. The                            patient tolerated the procedure well. The quality                            of the bowel preparation was adequate. The                            ileocecal valve, appendiceal orifice, and rectum                            were photographed. Scope In: 8:38:52 AM Scope Out: 9:05:28 AM Scope Withdrawal Time: 0 hours 17 minutes 45 seconds  Total Procedure Duration: 0 hours 26 minutes 36 seconds  Findings:                 The perianal and digital rectal examinations were                            normal.                           A 4 mm polyp was found in the cecum. The polyp was                            flat. The polyp was removed with a cold snare.                            Resection and retrieval were complete.                           A 3 mm polyp was found in the hepatic flexure. The  polyp was sessile. The polyp was removed with a                            cold snare. Resection and retrieval were complete.                           The prep was initially only fair and it took                            several minutes to clear her colon with lavage to                            achieve adequate views. The exam was otherwise                            without abnormality. Complications:            No immediate complications. Estimated blood loss:                            Minimal. Estimated Blood Loss:     Estimated blood loss was minimal. Impression:               - One 4 mm polyp in the cecum, removed with a cold                            snare. Resected and retrieved.                           - One 3 mm polyp at the hepatic flexure, removed                            with a cold snare. Resected and retrieved.                           - Fair prep  initially but with lavage adequate                            views obtained.                           - The examination was otherwise normal. Recommendation:           - Patient has a contact number available for                            emergencies. The signs and symptoms of potential                            delayed complications were discussed with the                            patient. Return to normal activities tomorrow.  Written discharge instructions were provided to the                            patient.                           - Resume previous diet.                           - Continue present medications.                           - Await pathology results. Remo Lipps P. Yvonne Stopher, MD 05/11/2018 9:10:56 AM This report has been signed electronically.

## 2018-05-12 ENCOUNTER — Telehealth: Payer: Self-pay

## 2018-05-12 NOTE — Telephone Encounter (Signed)
  Follow up Call-  Call back number 05/11/2018  Post procedure Call Back phone  # [phone]  Permission to leave phone message Yes  Some recent data might be hidden     Patient questions:  Do you have a fever, pain , or abdominal swelling? No. Pain Score  0 *  Have you tolerated food without any problems? Yes.    Have you been able to return to your normal activities? Yes.    Do you have any questions about your discharge instructions: Diet   No. Medications  No. Follow up visit  No.  Do you have questions or concerns about your Care? No.  Actions: * If pain score is 4 or above: No action needed, pain <4.  No problems noted per pt. maw

## 2018-05-17 ENCOUNTER — Encounter: Payer: Self-pay | Admitting: Gastroenterology

## 2018-06-01 ENCOUNTER — Other Ambulatory Visit (INDEPENDENT_AMBULATORY_CARE_PROVIDER_SITE_OTHER): Payer: BC Managed Care – PPO

## 2018-06-01 ENCOUNTER — Encounter: Payer: Self-pay | Admitting: Internal Medicine

## 2018-06-01 ENCOUNTER — Ambulatory Visit (INDEPENDENT_AMBULATORY_CARE_PROVIDER_SITE_OTHER): Payer: BC Managed Care – PPO | Admitting: Internal Medicine

## 2018-06-01 VITALS — BP 126/78 | HR 64 | Temp 97.8°F | Ht 67.0 in | Wt 272.0 lb

## 2018-06-01 DIAGNOSIS — Z0000 Encounter for general adult medical examination without abnormal findings: Secondary | ICD-10-CM | POA: Diagnosis not present

## 2018-06-01 DIAGNOSIS — R21 Rash and other nonspecific skin eruption: Secondary | ICD-10-CM | POA: Diagnosis not present

## 2018-06-01 DIAGNOSIS — Z23 Encounter for immunization: Secondary | ICD-10-CM | POA: Diagnosis not present

## 2018-06-01 DIAGNOSIS — M1990 Unspecified osteoarthritis, unspecified site: Secondary | ICD-10-CM | POA: Diagnosis not present

## 2018-06-01 LAB — COMPREHENSIVE METABOLIC PANEL
ALT: 14 U/L (ref 0–35)
AST: 15 U/L (ref 0–37)
Albumin: 4 g/dL (ref 3.5–5.2)
Alkaline Phosphatase: 60 U/L (ref 39–117)
BUN: 11 mg/dL (ref 6–23)
CO2: 26 meq/L (ref 19–32)
Calcium: 9 mg/dL (ref 8.4–10.5)
Chloride: 105 mEq/L (ref 96–112)
Creatinine, Ser: 0.8 mg/dL (ref 0.40–1.20)
GFR: 90.54 mL/min (ref >60.00)
GLUCOSE: 83 mg/dL (ref 70–99)
Potassium: 3.9 mEq/L (ref 3.5–5.1)
Sodium: 139 mEq/L (ref 135–145)
Total Bilirubin: 0.5 mg/dL (ref 0.2–1.2)
Total Protein: 6.8 g/dL (ref 6.0–8.3)

## 2018-06-01 LAB — CBC
HCT: 38.8 % (ref 36.0–46.0)
Hemoglobin: 12.5 g/dL (ref 12.0–15.0)
MCHC: 32.3 g/dL (ref 30.0–36.0)
MCV: 86.4 fl (ref 78.0–100.0)
Platelets: 225 10*3/uL (ref 150.0–400.0)
RBC: 4.49 Mil/uL (ref 3.87–5.11)
RDW: 14.8 % (ref 11.5–15.5)
WBC: 5.3 10*3/uL (ref 4.0–10.5)

## 2018-06-01 LAB — LIPID PANEL
CHOL/HDL RATIO: 3
Cholesterol: 161 mg/dL (ref 0–200)
HDL: 56.5 mg/dL (ref >39.00)
LDL Cholesterol: 95 mg/dL (ref 0–99)
NonHDL: 104.48
Triglycerides: 46 mg/dL (ref 0.0–149.0)
VLDL: 9.2 mg/dL (ref 0.0–40.0)

## 2018-06-01 LAB — HEMOGLOBIN A1C: Hgb A1c MFr Bld: 5.6 % (ref 4.6–6.5)

## 2018-06-01 MED ORDER — TRIAMCINOLONE ACETONIDE 0.1 % EX CREA: 1.0000 "application " | TOPICAL_CREAM | Freq: Two times a day (BID) | CUTANEOUS | 1 refills | Status: DC

## 2018-06-01 MED ORDER — DICLOFENAC SODIUM 75 MG PO TBEC: 75.0000 mg | DELAYED_RELEASE_TABLET | Freq: Two times a day (BID) | ORAL | 1 refills | Status: DC

## 2018-06-01 NOTE — Patient Instructions (Signed)
Think about making some small step goals to try 1 per month as we talked about.  If you want there is a book called bright line eating which spends the first half talking about why people struggle losing weight the science. The second half is about a diet and I do not recommend that.   Health Maintenance, Female Adopting a healthy lifestyle and getting preventive care can go a long way to promote health and wellness. Talk with your health care provider about what schedule of regular examinations is right for you. This is a good chance for you to check in with your provider about disease prevention and staying healthy. In between checkups, there are plenty of things you can do on your own. Experts have done a lot of research about which lifestyle changes and preventive measures are most likely to keep you healthy. Ask your health care provider for more information. Weight and diet Eat a healthy diet  Be sure to include plenty of vegetables, fruits, low-fat dairy products, and lean protein.  Do not eat a lot of foods high in solid fats, added sugars, or salt.  Get regular exercise. This is one of the most important things you can do for your health. ? Most adults should exercise for at least 150 minutes each week. The exercise should increase your heart rate and make you sweat (moderate-intensity exercise). ? Most adults should also do strengthening exercises at least twice a week. This is in addition to the moderate-intensity exercise. Maintain a healthy weight  Body mass index (BMI) is a measurement that can be used to identify possible weight problems. It estimates body fat based on height and weight. Your health care provider can help determine your BMI and help you achieve or maintain a healthy weight.  For females 54 years of age and older: ? A BMI below 18.5 is considered underweight. ? A BMI of 18.5 to 24.9 is normal. ? A BMI of 25 to 29.9 is considered overweight. ? A BMI of 30 and  above is considered obese. Watch levels of cholesterol and blood lipids  You should start having your blood tested for lipids and cholesterol at 54 years of age, then have this test every 5 years.  You may need to have your cholesterol levels checked more often if: ? Your lipid or cholesterol levels are high. ? You are older than 54 years of age. ? You are at high risk for heart disease. Cancer screening Lung Cancer  Lung cancer screening is recommended for adults 61-77 years old who are at high risk for lung cancer because of a history of smoking.  A yearly low-dose CT scan of the lungs is recommended for people who: ? Currently smoke. ? Have quit within the past 15 years. ? Have at least a 30-pack-year history of smoking. A pack year is smoking an average of one pack of cigarettes a day for 1 year.  Yearly screening should continue until it has been 15 years since you quit.  Yearly screening should stop if you develop a health problem that would prevent you from having lung cancer treatment. Breast Cancer  Practice breast self-awareness. This means understanding how your breasts normally appear and feel.  It also means doing regular breast self-exams. Let your health care provider know about any changes, no matter how small.  If you are in your 20s or 30s, you should have a clinical breast exam (CBE) by a health care provider every 1-3 years as  part of a regular health exam.  If you are 40 or older, have a CBE every year. Also consider having a breast X-ray (mammogram) every year.  If you have a family history of breast cancer, talk to your health care provider about genetic screening.  If you are at high risk for breast cancer, talk to your health care provider about having an MRI and a mammogram every year.  Breast cancer gene (BRCA) assessment is recommended for women who have family members with BRCA-related cancers. BRCA-related cancers  include: ? Breast. ? Ovarian. ? Tubal. ? Peritoneal cancers.  Results of the assessment will determine the need for genetic counseling and BRCA1 and BRCA2 testing. Cervical Cancer Your health care provider may recommend that you be screened regularly for cancer of the pelvic organs (ovaries, uterus, and vagina). This screening involves a pelvic examination, including checking for microscopic changes to the surface of your cervix (Pap test). You may be encouraged to have this screening done every 3 years, beginning at age 35.  For women ages 69-65, health care providers may recommend pelvic exams and Pap testing every 3 years, or they may recommend the Pap and pelvic exam, combined with testing for human papilloma virus (HPV), every 5 years. Some types of HPV increase your risk of cervical cancer. Testing for HPV may also be done on women of any age with unclear Pap test results.  Other health care providers may not recommend any screening for nonpregnant women who are considered low risk for pelvic cancer and who do not have symptoms. Ask your health care provider if a screening pelvic exam is right for you.  If you have had past treatment for cervical cancer or a condition that could lead to cancer, you need Pap tests and screening for cancer for at least 20 years after your treatment. If Pap tests have been discontinued, your risk factors (such as having a new sexual partner) need to be reassessed to determine if screening should resume. Some women have medical problems that increase the chance of getting cervical cancer. In these cases, your health care provider may recommend more frequent screening and Pap tests. Colorectal Cancer  This type of cancer can be detected and often prevented.  Routine colorectal cancer screening usually begins at 54 years of age and continues through 54 years of age.  Your health care provider may recommend screening at an earlier age if you have risk factors for  colon cancer.  Your health care provider may also recommend using home test kits to check for hidden blood in the stool.  A small camera at the end of a tube can be used to examine your colon directly (sigmoidoscopy or colonoscopy). This is done to check for the earliest forms of colorectal cancer.  Routine screening usually begins at age 80.  Direct examination of the colon should be repeated every 5-10 years through 54 years of age. However, you may need to be screened more often if early forms of precancerous polyps or small growths are found. Skin Cancer  Check your skin from head to toe regularly.  Tell your health care provider about any new moles or changes in moles, especially if there is a change in a mole's shape or color.  Also tell your health care provider if you have a mole that is larger than the size of a pencil eraser.  Always use sunscreen. Apply sunscreen liberally and repeatedly throughout the day.  Protect yourself by wearing long sleeves, pants,  a wide-brimmed hat, and sunglasses whenever you are outside. Heart disease, diabetes, and high blood pressure  High blood pressure causes heart disease and increases the risk of stroke. High blood pressure is more likely to develop in: ? People who have blood pressure in the high end of the normal range (130-139/85-89 mm Hg). ? People who are overweight or obese. ? People who are African American.  If you are 38-79 years of age, have your blood pressure checked every 3-5 years. If you are 89 years of age or older, have your blood pressure checked every year. You should have your blood pressure measured twice-once when you are at a hospital or clinic, and once when you are not at a hospital or clinic. Record the average of the two measurements. To check your blood pressure when you are not at a hospital or clinic, you can use: ? An automated blood pressure machine at a pharmacy. ? A home blood pressure monitor.  If you are  between 68 years and 36 years old, ask your health care provider if you should take aspirin to prevent strokes.  Have regular diabetes screenings. This involves taking a blood sample to check your fasting blood sugar level. ? If you are at a normal weight and have a low risk for diabetes, have this test once every three years after 54 years of age. ? If you are overweight and have a high risk for diabetes, consider being tested at a younger age or more often. Preventing infection Hepatitis B  If you have a higher risk for hepatitis B, you should be screened for this virus. You are considered at high risk for hepatitis B if: ? You were born in a country where hepatitis B is common. Ask your health care provider which countries are considered high risk. ? Your parents were born in a high-risk country, and you have not been immunized against hepatitis B (hepatitis B vaccine). ? You have HIV or AIDS. ? You use needles to inject street drugs. ? You live with someone who has hepatitis B. ? You have had sex with someone who has hepatitis B. ? You get hemodialysis treatment. ? You take certain medicines for conditions, including cancer, organ transplantation, and autoimmune conditions. Hepatitis C  Blood testing is recommended for: ? Everyone born from [birth year] through 1966. ? Anyone with known risk factors for hepatitis C. Sexually transmitted infections (STIs)  You should be screened for sexually transmitted infections (STIs) including gonorrhea and chlamydia if: ? You are sexually active and are younger than 54 years of age. ? You are older than 54 years of age and your health care provider tells you that you are at risk for this type of infection. ? Your sexual activity has changed since you were last screened and you are at an increased risk for chlamydia or gonorrhea. Ask your health care provider if you are at risk.  If you do not have HIV, but are at risk, it may be recommended that you take  a prescription medicine daily to prevent HIV infection. This is called pre-exposure prophylaxis (PrEP). You are considered at risk if: ? You are sexually active and do not regularly use condoms or know the HIV status of your partner(s). ? You take drugs by injection. ? You are sexually active with a partner who has HIV. Talk with your health care provider about whether you are at high risk of being infected with HIV. If you choose to begin PrEP, you  should first be tested for HIV. You should then be tested every 3 months for as long as you are taking PrEP. Pregnancy  If you are premenopausal and you may become pregnant, ask your health care provider about preconception counseling.  If you may become pregnant, take 400 to 800 micrograms (mcg) of folic acid every day.  If you want to prevent pregnancy, talk to your health care provider about birth control (contraception). Osteoporosis and menopause  Osteoporosis is a disease in which the bones lose minerals and strength with aging. This can result in serious bone fractures. Your risk for osteoporosis can be identified using a bone density scan.  If you are 9 years of age or older, or if you are at risk for osteoporosis and fractures, ask your health care provider if you should be screened.  Ask your health care provider whether you should take a calcium or vitamin D supplement to lower your risk for osteoporosis.  Menopause may have certain physical symptoms and risks.  Hormone replacement therapy may reduce some of these symptoms and risks. Talk to your health care provider about whether hormone replacement therapy is right for you. Follow these instructions at home:  Schedule regular health, dental, and eye exams.  Stay current with your immunizations.  Do not use any tobacco products including cigarettes, chewing tobacco, or electronic cigarettes.  If you are pregnant, do not drink alcohol.  If you are breastfeeding, limit how  much and how often you drink alcohol.  Limit alcohol intake to no more than 1 drink per day for nonpregnant women. One drink equals 12 ounces of beer, 5 ounces of wine, or 1 ounces of hard liquor.  Do not use street drugs.  Do not share needles.  Ask your health care provider for help if you need support or information about quitting drugs.  Tell your health care provider if you often feel depressed.  Tell your health care provider if you have ever been abused or do not feel safe at home. This information is not intended to replace advice given to you by your health care provider. Make sure you discuss any questions you have with your health care provider. Document Released: 10/28/2010 Document Revised: 09/20/2015 Document Reviewed: 01/16/2015 Elsevier Interactive Patient Education  2019 [employer].

## 2018-06-01 NOTE — Progress Notes (Signed)
   Subjective:   Patient ID: Kylie [name], female    DOB: 1966-02-02, 53 y.o.   MRN: [id number]  HPI The patient is a 53 YO female coming in for physical.   PMH, [place], social history reviewed and updated  Review of Systems  Constitutional: Negative.   HENT: Negative.   Eyes: Negative.   Respiratory: Negative for cough, chest tightness and shortness of breath.   Cardiovascular: Negative for chest pain, palpitations and leg swelling.  Gastrointestinal: Negative for abdominal distention, abdominal pain, constipation, diarrhea, nausea and vomiting.  Musculoskeletal: Negative.   Skin: Negative.   Neurological: Negative.   Psychiatric/Behavioral: Negative.     Objective:  Physical Exam Constitutional:      Appearance: She is well-developed. She is obese.  HENT:     Head: Normocephalic and atraumatic.  Neck:     Musculoskeletal: Normal range of motion.  Cardiovascular:     Rate and Rhythm: Normal rate and regular rhythm.  Pulmonary:     Effort: Pulmonary effort is normal. No respiratory distress.     Breath sounds: Normal breath sounds. No wheezing or rales.  Abdominal:     General: Bowel sounds are normal. There is no distension.     Palpations: Abdomen is soft.     Tenderness: There is no abdominal tenderness. There is no rebound.  Skin:    General: Skin is warm and dry.  Neurological:     Mental Status: She is alert and oriented to person, place, and time.     Coordination: Coordination normal.     Vitals:   06/01/18 1420  BP: 126/78  Pulse: 64  Temp: 97.8 F (36.6 C)  TempSrc: Oral  SpO2: 99%  Weight: 272 lb (123.4 kg)  Height: 5\' 7"  (1.702 m)    Assessment & Plan:  Tdap given at visit

## 2018-06-02 ENCOUNTER — Telehealth: Payer: Self-pay | Admitting: Internal Medicine

## 2018-06-02 DIAGNOSIS — M1990 Unspecified osteoarthritis, unspecified site: Secondary | ICD-10-CM | POA: Insufficient documentation

## 2018-06-02 NOTE — Assessment & Plan Note (Signed)
Refill triamcinolone

## 2018-06-02 NOTE — Assessment & Plan Note (Signed)
Counseled about weight and diet as well as exercise to help.

## 2018-06-02 NOTE — Telephone Encounter (Signed)
Copied from [place] [id number]. Topic: Quick Communication - Rx Refill/Question >> Jun 02, 2018  2:16 PM Margot Ables wrote: Medication: phentermine (ADIPEX-P) 37.5 MG tablet - pt thought RX was being sent in for her after OV yesterday with Dr. Sharlet Salina. Please advise.   Has the patient contacted their pharmacy? Yes - no RX Preferred Pharmacy (with phone number or street name): [id number] [address], [place] [phone] (Phone) [phone] (Fax)

## 2018-06-02 NOTE — Telephone Encounter (Signed)
Dr Sharlet Salina states that she never had a discussion about the phentermine during their visit that she would have to make a visit to discuss this medication. Can you please schedule patient. Thank you

## 2018-06-02 NOTE — Assessment & Plan Note (Signed)
Rx for voltaren and advised podiatry or sports medicine for right ankle if no improvement.

## 2018-06-02 NOTE — Assessment & Plan Note (Signed)
Flu shot declines. Shingrix counseled. Tetanus given. Colonoscopy up to date. Mammogram up to date, pap smear up to date. Counseled about sun safety and mole surveillance. Counseled about the dangers of distracted driving. Given 10 year screening recommendations.

## 2018-06-03 NOTE — Telephone Encounter (Signed)
appt made

## 2018-06-10 ENCOUNTER — Ambulatory Visit: Payer: BC Managed Care – PPO | Admitting: Internal Medicine

## 2018-06-10 ENCOUNTER — Encounter: Payer: Self-pay | Admitting: Internal Medicine

## 2018-06-10 MED ORDER — LORCASERIN HCL ER 20 MG PO TB24: 20.0000 mg | ORAL_TABLET | Freq: Every day | ORAL | 5 refills | Status: DC

## 2018-06-10 NOTE — Patient Instructions (Signed)
We will try the belviq for weight loss which is 1 pill daily.

## 2018-06-10 NOTE — Progress Notes (Signed)
   Subjective:   Patient ID: Kylie [name], female    DOB: 1966/02/22, 53 y.o.   MRN: [id number]  HPI The patient is a 53 YO female coming in for weight loss management. She has taken phentermine in the past and wants a new rx for this. She has taken it for about 7 months total in the last 2 years. She is open to other things but was not sure about other things. She has been working on diet, no exercise currently.   Review of Systems  Constitutional: Negative.   HENT: Negative.   Eyes: Negative.   Respiratory: Negative for cough, chest tightness and shortness of breath.   Cardiovascular: Negative for chest pain, palpitations and leg swelling.  Gastrointestinal: Negative for abdominal distention, abdominal pain, constipation, diarrhea, nausea and vomiting.  Musculoskeletal: Negative.   Skin: Negative.   Neurological: Negative.   Psychiatric/Behavioral: Negative.     Objective:  Physical Exam Constitutional:      Appearance: She is well-developed.  HENT:     Head: Normocephalic and atraumatic.  Neck:     Musculoskeletal: Normal range of motion.  Cardiovascular:     Rate and Rhythm: Normal rate and regular rhythm.  Pulmonary:     Effort: Pulmonary effort is normal. No respiratory distress.     Breath sounds: Normal breath sounds. No wheezing or rales.  Abdominal:     General: Bowel sounds are normal. There is no distension.     Palpations: Abdomen is soft.     Tenderness: There is no abdominal tenderness. There is no rebound.  Skin:    General: Skin is warm and dry.  Neurological:     Mental Status: She is alert and oriented to person, place, and time.     Coordination: Coordination normal.     Vitals:   06/10/18 1522  BP: 130/70  Pulse: 70  Temp: 97.8 F (36.6 C)  TempSrc: Oral  SpO2: 99%  Weight: 271 lb (122.9 kg)  Height: 5\' 7"  (1.702 m)    Assessment & Plan:

## 2018-06-11 NOTE — Assessment & Plan Note (Signed)
Rx for belviq for weight loss and encouraged her to continue working on changes to diet and exercise.

## 2018-06-16 ENCOUNTER — Telehealth: Payer: Self-pay

## 2018-06-16 NOTE — Telephone Encounter (Signed)
PA for belviq XR 20 mg ER tablets.   FRE:[id number]  Waiting on response.

## 2018-06-16 NOTE — Telephone Encounter (Signed)
PA approved 06/16/2018-06/17/2019

## 2018-07-06 ENCOUNTER — Ambulatory Visit: Payer: BC Managed Care – PPO | Admitting: Internal Medicine

## 2018-07-06 ENCOUNTER — Encounter: Payer: Self-pay | Admitting: Internal Medicine

## 2018-07-06 DIAGNOSIS — R040 Epistaxis: Secondary | ICD-10-CM | POA: Diagnosis not present

## 2018-07-06 NOTE — Patient Instructions (Addendum)
Hold pressure to the nose if it bleeds for 10 minutes without letting go.    Nosebleed, Adult A nosebleed is when blood comes out of the nose. Nosebleeds are common. Usually, they are not a sign of a serious condition. Nosebleeds can happen if a small blood vessel in your nose starts to bleed or if the lining of your nose (mucous membrane) cracks. They are commonly caused by:  Allergies.  Colds.  Picking your nose.  Blowing your nose too hard.  An injury from sticking an object into your nose or getting hit in the nose.  Dry or cold air. Less common causes of nosebleeds include:  Toxic fumes.  Something abnormal in the nose or in the air-filled spaces in the bones of the face (sinuses).  Growths in the nose, such as polyps.  Medicines or conditions that cause blood to clot slowly.  Certain illnesses or procedures that irritate or dry out the nasal passages. Follow these instructions at home: When you have a nosebleed:   Sit down and tilt your head slightly forward.  Use a clean towel or tissue to pinch your nostrils under the bony part of your nose. After 10 minutes, let go of your nose and see if bleeding starts again. Do not release pressure before that time. If there is still bleeding, repeat the pinching and holding for 10 minutes until the bleeding stops.  Do not place tissues or gauze in the nose to stop bleeding.  Avoid lying down and avoid tilting your head backward. That may make blood collect in the throat and cause gagging or coughing.  Use a nasal spray decongestant to help with a nosebleed as told by your health care provider.  Do not use petroleum jelly or mineral oil in your nose. It can drip into your lungs. After a nosebleed:  Avoid blowing your nose or sniffing for a number of hours.  Avoid straining, lifting, or bending at the waist for several days. You may resume other normal activities as you are able.  Use saline spray or a humidifier as told  by your health care provider.  Aspirinand blood thinners make bleeding more likely. If you are prescribed these medicines and you suffer from nosebleeds: ? Ask your health care provider if you should stop taking the medicines or if you should adjust the dose. ? Do not stop taking medicines that your health care provider has recommended unless told by your health care provider.  If your nosebleed was caused by dry mucous membranes, use over-the-counter saline nasal spray or gel. This will keep the mucous membranes moist and allow them to heal. If you must use a lubricant: ? Choose one that is water-soluble. ? Use only as much as you need and use it only as often as needed. ? Do not lie down until several hours after you use it. Contact a health care provider if:  You have a fever.  You get nosebleeds often or more often than usual.  You bruise very easily.  You have a nosebleed from having something stuck in your nose.  You have bleeding in your mouth.  You vomit or cough up brown material.  You have a nosebleed after you start a new medicine. Get help right away if:  You have a nosebleed after a fall or a head injury.  Your nosebleed does not go away after 20 minutes.  You feel dizzy or weak.  You have unusual bleeding from other parts of your body.  You have unusual bruising on other parts of your body.  You become sweaty.  You vomit blood. This information is not intended to replace advice given to you by your health care provider. Make sure you discuss any questions you have with your health care provider. Document Released: 01/22/2005 Document Revised: 08/19/2016 Document Reviewed: 10/30/2015 Elsevier Interactive Patient Education  2019 [employer].

## 2018-07-06 NOTE — Progress Notes (Signed)
   Subjective:   Patient ID: Kylie [name], female    DOB: 1965-07-07, 53 y.o.   MRN: [id number]  HPI The patient is a 53 YO female coming in for nose bleeds. Started yesterday while in the shower. She had a lot of bleeding and held pressure off and on. Took about 40 minutes to get it to stop. She denies picking or blowing or coughing or sneezing prior to onset. Denies recent cold or sinus problems. Not taking any otc products or allergy medicine recently. Had another nosebleed today. This one lasted about 10 minutes only and stopped okay. Both times left nostril. Denies picking or blowing. Did take ibuprofen twice in the last week for dental problem. Denies aspirin or other medications otc. Denies lightheadedness or dizziness since nosebleeds. Denies nausea or vomiting.   Review of Systems  Constitutional: Negative.   HENT: Positive for nosebleeds.   Eyes: Negative.   Respiratory: Negative for cough, chest tightness and shortness of breath.   Cardiovascular: Negative for chest pain, palpitations and leg swelling.  Gastrointestinal: Negative for abdominal distention, abdominal pain, constipation, diarrhea, nausea and vomiting.  Musculoskeletal: Negative.   Skin: Negative.   Neurological: Negative.   Psychiatric/Behavioral: Negative.     Objective:  Physical Exam Constitutional:      Appearance: She is well-developed.  HENT:     Head: Normocephalic and atraumatic.     Right Ear: Tympanic membrane normal.     Left Ear: Tympanic membrane normal.     Nose:     Comments: Sore on the left nostril without bleeding or clot, no exposed blood vessels seen Neck:     Musculoskeletal: Normal range of motion.  Cardiovascular:     Rate and Rhythm: Normal rate and regular rhythm.  Pulmonary:     Effort: Pulmonary effort is normal. No respiratory distress.     Breath sounds: Normal breath sounds. No wheezing or rales.  Abdominal:     General: Bowel sounds are normal. There is no distension.    Palpations: Abdomen is soft.     Tenderness: There is no abdominal tenderness. There is no rebound.  Skin:    General: Skin is warm and dry.  Neurological:     Mental Status: She is alert and oriented to person, place, and time.     Coordination: Coordination normal.     Vitals:   07/06/18 1043  BP: 126/78  Pulse: 65  Temp: (!) 97.4 F (36.3 C)  TempSrc: Oral  SpO2: 96%  Weight: 269 lb (122 kg)  Height: 5\' 7"  (1.702 m)    Assessment & Plan:

## 2018-07-06 NOTE — Assessment & Plan Note (Signed)
Taking oral nsaids which could have contributed. Advised humidifier and avoid picking or blowing. Talked to her about afrin for nosebleed which will not stop. Advised she could have a couple in the next few days and just to hold pressure for 10 minutes without checking to see if still bleeding. If not stopping urgent care or ER.

## 2018-11-17 ENCOUNTER — Other Ambulatory Visit: Payer: Self-pay

## 2018-11-17 ENCOUNTER — Other Ambulatory Visit (INDEPENDENT_AMBULATORY_CARE_PROVIDER_SITE_OTHER): Payer: BC Managed Care – PPO

## 2018-11-17 ENCOUNTER — Ambulatory Visit (HOSPITAL_COMMUNITY)
Admission: RE | Admit: 2018-11-17 | Discharge: 2018-11-17 | Disposition: A | Discharge status: Home / Self Care | Payer: BC Managed Care – PPO | Source: Ambulatory Visit | Attending: Internal Medicine | Admitting: Internal Medicine

## 2018-11-17 ENCOUNTER — Telehealth: Payer: Self-pay | Admitting: Internal Medicine

## 2018-11-17 ENCOUNTER — Other Ambulatory Visit: Payer: Self-pay | Admitting: Internal Medicine

## 2018-11-17 ENCOUNTER — Ambulatory Visit (INDEPENDENT_AMBULATORY_CARE_PROVIDER_SITE_OTHER): Payer: BC Managed Care – PPO | Admitting: Internal Medicine

## 2018-11-17 ENCOUNTER — Ambulatory Visit: Payer: Self-pay

## 2018-11-17 ENCOUNTER — Other Ambulatory Visit: Payer: BC Managed Care – PPO

## 2018-11-17 ENCOUNTER — Encounter: Payer: Self-pay | Admitting: Internal Medicine

## 2018-11-17 ENCOUNTER — Ambulatory Visit (INDEPENDENT_AMBULATORY_CARE_PROVIDER_SITE_OTHER)
Admission: RE | Admit: 2018-11-17 | Discharge: 2018-11-17 | Disposition: A | Discharge status: Home / Self Care | Payer: BC Managed Care – PPO | Source: Ambulatory Visit | Attending: Internal Medicine | Admitting: Internal Medicine

## 2018-11-17 ENCOUNTER — Ambulatory Visit
Admission: RE | Admit: 2018-11-17 | Discharge: 2018-11-17 | Disposition: A | Discharge status: Home / Self Care | Payer: BC Managed Care – PPO | Source: Ambulatory Visit | Attending: Internal Medicine | Admitting: Internal Medicine

## 2018-11-17 VITALS — BP 128/72 | HR 62 | Temp 98.3°F | Resp 16 | Ht 67.0 in | Wt 273.0 lb

## 2018-11-17 DIAGNOSIS — R945 Abnormal results of liver function studies: Secondary | ICD-10-CM | POA: Diagnosis not present

## 2018-11-17 DIAGNOSIS — R079 Chest pain, unspecified: Secondary | ICD-10-CM | POA: Insufficient documentation

## 2018-11-17 DIAGNOSIS — K8020 Calculus of gallbladder without cholecystitis without obstruction: Secondary | ICD-10-CM

## 2018-11-17 DIAGNOSIS — R10811 Right upper quadrant abdominal tenderness: Secondary | ICD-10-CM

## 2018-11-17 DIAGNOSIS — R7989 Other specified abnormal findings of blood chemistry: Secondary | ICD-10-CM

## 2018-11-17 LAB — BASIC METABOLIC PANEL
BUN: 8 mg/dL (ref 6–23)
CO2: 31 mEq/L (ref 19–32)
Calcium: 9.4 mg/dL (ref 8.4–10.5)
Chloride: 104 mEq/L (ref 96–112)
Creatinine, Ser: 0.97 mg/dL (ref 0.40–1.20)
GFR: 72.36 mL/min (ref >60.00)
Glucose, Bld: 88 mg/dL (ref 70–99)
Potassium: 4.2 mEq/L (ref 3.5–5.1)
Sodium: 140 mEq/L (ref 135–145)

## 2018-11-17 LAB — URINALYSIS, ROUTINE W REFLEX MICROSCOPIC
Bilirubin Urine: NEGATIVE
Hgb urine dipstick: NEGATIVE
Ketones, ur: NEGATIVE
Leukocytes,Ua: NEGATIVE
Nitrite: NEGATIVE
RBC / HPF: NONE SEEN (ref 0–?)
Specific Gravity, Urine: 1.02 (ref 1.000–1.030)
Total Protein, Urine: NEGATIVE
Urine Glucose: NEGATIVE
Urobilinogen, UA: 0.2 (ref 0.0–1.0)
pH: 8 (ref 5.0–8.0)

## 2018-11-17 LAB — HEPATIC FUNCTION PANEL
ALT: 176 U/L — ABNORMAL HIGH (ref 0–35)
AST: 130 U/L — ABNORMAL HIGH (ref 0–37)
Albumin: 4.3 g/dL (ref 3.5–5.2)
Alkaline Phosphatase: 124 U/L — ABNORMAL HIGH (ref 39–117)
Bilirubin, Direct: 0.3 mg/dL (ref 0.0–0.3)
Total Bilirubin: 1.1 mg/dL (ref 0.2–1.2)
Total Protein: 7.3 g/dL (ref 6.0–8.3)

## 2018-11-17 LAB — CBC WITH DIFFERENTIAL/PLATELET
Basophils Absolute: 0.1 10*3/uL (ref 0.0–0.1)
Basophils Relative: 1.2 % (ref 0.0–3.0)
Eosinophils Absolute: 0.1 10*3/uL (ref 0.0–0.7)
Eosinophils Relative: 1.7 % (ref 0.0–5.0)
HCT: 41.1 % (ref 36.0–46.0)
Hemoglobin: 13.1 g/dL (ref 12.0–15.0)
Lymphocytes Relative: 28.3 % (ref 12.0–46.0)
Lymphs Abs: 1.5 10*3/uL (ref 0.7–4.0)
MCHC: 31.9 g/dL (ref 30.0–36.0)
MCV: 86.8 fl (ref 78.0–100.0)
Monocytes Absolute: 0.4 10*3/uL (ref 0.1–1.0)
Monocytes Relative: 8.2 % (ref 3.0–12.0)
Neutro Abs: 3.3 10*3/uL (ref 1.4–7.7)
Neutrophils Relative %: 60.6 % (ref 43.0–77.0)
Platelets: 250 10*3/uL (ref 150.0–400.0)
RBC: 4.73 Mil/uL (ref 3.87–5.11)
RDW: 14.7 % (ref 11.5–15.5)
WBC: 5.4 10*3/uL (ref 4.0–10.5)

## 2018-11-17 LAB — LIPASE: Lipase: 5 U/L — ABNORMAL LOW (ref 11.0–59.0)

## 2018-11-17 LAB — AMYLASE: Amylase: 37 U/L (ref 27–131)

## 2018-11-17 MED ORDER — GADOBUTROL 1 MMOL/ML IV SOLN: 10.0000 mL | Freq: Once | INTRAVENOUS | Status: DC | PRN

## 2018-11-17 NOTE — Telephone Encounter (Signed)
Phone call from pt. With report of onset of nausea on Sunday night approx. 10:00 PM.   Reported onset of chest pain beneath right breast at approx. 12:00 PM yesterday; eventually the chest pain radiated around to right shoulder blade; pain lasted about 4 hrs.; rated at 9/10.  Stated it felt like a tight squeezing from front to back.  Reported she had intermittent nausea and vomited approx. 4-5 times yesterday.  Denied shortness of breath, or sweating.  Stated no anterior chest pain today, but there is a residual soreness in her right shoulder blade area.  Does continue to c/o of intermittent nausea.  Called [facility]; transferred pt. To office to have an appt. scheduled.  Pt. Agreed with plan.   Reason for Disposition . [1] Chest pain lasts > 5 minutes AND [2] occurred in past 3 days (72 hours)  Answer Assessment - Initial Assessment Questions 1. LOCATION: "Where does it hurt?"       Underneath right breast; tight squeezing from front to back 2. RADIATION: "Does the pain go anywhere else?" (e.g., into neck, jaw, arms, back)    From right breast area to the back in shoulder blade region 3. ONSET: "When did the chest pain begin?" (Minutes, hours or days)      Sunday at approx. 10:00 PM - had nausea; Monday chest pain onset at 12:00 PM 4. PATTERN "Does the pain come and go, or has it been constant since it started?"  "Does it get worse with exertion?"      It was steady over 4 hrs. with fluctuation in intensity 5. DURATION: "How long does it last" (e.g., seconds, minutes, hours)    4 hrs.  6. SEVERITY: "How bad is the pain?"  (e.g., Scale 1-10; mild, moderate, or severe)    - MILD (1-3): doesn't interfere with normal activities     - MODERATE (4-7): interferes with normal activities or awakens from sleep    - SEVERE (8-10): excruciating pain, unable to do any normal activities       Denies any pain in chest now; was rating at 9/10, yesterday  7. CARDIAC RISK FACTORS: "Do you have any history of heart  problems or risk factors for heart disease?" (e.g., angina, prior heart attack; diabetes, high blood pressure, high cholesterol, smoker, or strong family history of heart disease)    Denied high BP, denied high cholesterol, non-smoker, Is obese,  paternal grandparents had heart attack.  8. PULMONARY RISK FACTORS: "Do you have any history of lung disease?"  (e.g., blood clots in lung, asthma, emphysema, birth control pills)     No pulm history 9. CAUSE: "What do you think is causing the chest pain?"     initially thought she was constipated  10. OTHER SYMPTOMS: "Do you have any other symptoms?" (e.g., dizziness, nausea, vomiting, sweating, fever, difficulty breathing, cough)      Intermittent Nausea, vomited 4-5 times yesterday; no sweating, no shortness of breath  11. PREGNANCY: "Is there any chance you are pregnant?" "When was your last menstrual period?"       In menopause  Protocols used: CHEST PAIN-A-AH

## 2018-11-17 NOTE — Progress Notes (Signed)
Subjective:  Patient ID: Kylie [name], female    DOB: 1966-02-02  Age: 53 y.o. MRN: [id number]  CC: Chest Pain and Abdominal Pain   HPI Kylie [name] presents for concerns about a 2-day history of right upper quadrant pain that radiates up into her chest and into her back with LOA, nausea, and vomiting.  She describes the pain as sharp and squeezing and increases with movement.  She had constipation 4 days ago so she took a dose of milk of magnesia.  That cause diarrhea but she has not noticed any blood in her stool or melena.  She tried to control the pain with aspirin and Motrin.  Outpatient Medications Prior to Visit  Medication Sig Dispense Refill   diclofenac (VOLTAREN) 75 MG EC tablet Take 1 tablet (75 mg total) by mouth 2 (two) times daily. 60 tablet 1   ibuprofen (ADVIL,MOTRIN) 800 MG tablet Take 800 mg by mouth every 8 (eight) hours as needed. for pain     triamcinolone cream (KENALOG) 0.1 % Apply 1 application topically 2 (two) times daily. 100 g 1   No facility-administered medications prior to visit.     ROS Review of Systems  Objective:  BP 128/72    Pulse 62    Temp 98.3 F (36.8 C) (Oral)    Resp 16    Ht 5' 7" (1.702 m)    Wt 273 lb (123.8 kg)    SpO2 98%    BMI 42.76 kg/m   BP Readings from Last 3 Encounters:  11/17/18 128/72  07/06/18 126/78  06/10/18 130/70    Wt Readings from Last 3 Encounters:  11/17/18 273 lb (123.8 kg)  07/06/18 269 lb (122 kg)  06/10/18 271 lb (122.9 kg)    Physical Exam Vitals signs reviewed.  Constitutional:      General: She is not in acute distress.    Appearance: She is obese. She is not ill-appearing, toxic-appearing or diaphoretic.  HENT:     Nose: Nose normal.     Mouth/Throat:     Mouth: Mucous membranes are moist.     Pharynx: No oropharyngeal exudate.  Eyes:     General: No scleral icterus.    Conjunctiva/sclera: Conjunctivae normal.  Neck:     Musculoskeletal: Normal range of motion. No neck rigidity.    Cardiovascular:     Rate and Rhythm: Normal rate and regular rhythm.     Heart sounds: No murmur.     Comments: EKG ----  Sinus  Rhythm  WITHIN NORMAL LIMITS Pulmonary:     Effort: Pulmonary effort is normal.     Breath sounds: No stridor. No wheezing, rhonchi or rales.  Abdominal:     General: Abdomen is protuberant. Bowel sounds are normal. There is no distension.     Palpations: Abdomen is soft. There is no hepatomegaly, splenomegaly or mass.     Tenderness: There is abdominal tenderness in the right upper quadrant. There is no guarding or rebound. Positive signs include Murphy's sign.  Musculoskeletal: Normal range of motion.        General: No swelling.     Right lower leg: No edema.     Left lower leg: No edema.  Skin:    General: Skin is warm and dry.     Coloration: Skin is not pale.  Neurological:     General: No focal deficit present.     Mental Status: She is alert.     Lab Results  Component Value Date   WBC 5.4 11/17/2018   HGB 13.1 11/17/2018   HCT 41.1 11/17/2018   PLT 250.0 11/17/2018   GLUCOSE 88 11/17/2018   CHOL 161 06/01/2018   TRIG 46.0 06/01/2018   HDL 56.50 06/01/2018   LDLCALC 95 06/01/2018   ALT 176 (H) 11/17/2018   AST 130 (H) 11/17/2018   NA 140 11/17/2018   K 4.2 11/17/2018   CL 104 11/17/2018   CREATININE 0.97 11/17/2018   BUN 8 11/17/2018   CO2 31 11/17/2018   INR 1.0 11/01/2007   HGBA1C 5.6 06/01/2018    Mm Digital Screening Bilateral  Result Date: 10/19/2017 CLINICAL DATA:  Screening. EXAM: DIGITAL SCREENING BILATERAL MAMMOGRAM WITH CAD COMPARISON:  Previous exam(s). ACR Breast Density Category b: There are scattered areas of fibroglandular density. FINDINGS: There are no findings suspicious for malignancy. Images were processed with CAD. IMPRESSION: No mammographic evidence of malignancy. A result letter of this screening mammogram will be mailed directly to the patient. RECOMMENDATION: Screening mammogram in one year.  (Code:SM-B-01Y) BI-RADS CATEGORY  1: Negative. Electronically Signed   By: Curlene Dolphin M.D.   On: 10/19/2017 15:44   Dg Abd Acute 2+v W 1v Chest  Result Date: 11/17/2018 CLINICAL DATA:  Right upper quadrant pain.  Nausea vomiting. EXAM: DG ABDOMEN ACUTE W/ 1V CHEST COMPARISON:  None. FINDINGS: Cardiac shadow is prominent.  The chest is otherwise normal. No free air, portal venous gas, or pneumatosis. Multiple adjacent rounded radiodensities project over the right renal shadow and a single view. Given the clustered nature, these could be within the gallbladder and would not be typical for renal stones. No other evidence of renal stones noted. Two calcifications are seen in the left pelvis. No bowel obstruction noted. IMPRESSION: 1. The cardiac shadow is prominent which could be at least partially due to the portable technique. Cardiomegaly or a pericardial effusion are not excluded. 2. Possible cholelithiasis. Ultrasound could better evaluate the gallbladder. 3. Calcifications in the left pelvis are probably phleboliths. Electronically Signed   By: Dorise Bullion III M.D   On: 11/17/2018 12:54   US Abdomen Limited Ruq  Result Date: 11/17/2018 CLINICAL DATA:  Right upper quadrant pain. EXAM: ULTRASOUND ABDOMEN LIMITED RIGHT UPPER QUADRANT COMPARISON:  Abdominal series 11/17/2018. FINDINGS: Gallbladder: Gallstones noted. Gallbladder wall thickness 2.2 mm. Negative Murphy sign. Multiple gallstones are noted, the largest measures 1.5 cm. Common bile duct: Diameter: 7.5 mm Liver: No focal lesion identified. Within normal limits in parenchymal echogenicity. Portal vein is patent on color Doppler imaging with normal direction of blood flow towards the liver. IMPRESSION: 1.  Multiple gallstones.  No evidence of cholecystitis. 2. Slightly prominent common bile duct at 7.5 mm. Although no obstructing abnormality identified, common bile duct obstruction cannot be excluded. Electronically Signed   By: Marcello Moores  Register    On: 11/17/2018 15:02     Assessment & Plan:   Kylie [name] was seen today for chest pain and abdominal pain.  Diagnoses and all orders for this visit:  Chest pain, unspecified type-She does not have chest pain.  She has right upper quadrant pain that radiates into her chest. -     EKG 12-Lead -     DG ABD ACUTE 2+V W 1V CHEST; Future  Right upper quadrant abdominal tenderness without rebound tenderness- She has a 2-day history of right upper quadrant pain with other constitutional symptoms.  Her alk phos, AST, and ALT are mildly elevated but her bilirubin is normal.  Plain films are  positive for gallstones, ultrasound confirms gallstones and a dilated common bile duct.  I have asked her to undergo MRCP to see if she has a retained stone.  If she does then she will have to undergo ERCP.  If she does not then I will refer her to general surgery to consider cholecystectomy. -     CBC with Differential/Platelet; Future -     Basic metabolic panel; Future -     Lipase; Future -     Amylase; Future -     Hepatic function panel; Future -     Urinalysis, Routine w reflex microscopic; Future -     DG ABD ACUTE 2+V W 1V CHEST; Future -     Cancel: US Abdomen Limited RUQ; Future -     US Abdomen Limited RUQ; Future  Elevated LFTs- See above -     Cancel: US Abdomen Limited RUQ; Future -     US Abdomen Limited RUQ; Future   I am having Kylie [name] maintain her diclofenac, triamcinolone cream, and ibuprofen.  No orders of the defined types were placed in this encounter.    Follow-up: Return if symptoms worsen or fail to improve.  Scarlette Calico, MD

## 2018-11-17 NOTE — Patient Instructions (Signed)
Abdominal Pain, Adult Abdominal pain can be caused by many things. Often, abdominal pain is not serious and it gets better with no treatment or by being treated at home. However, sometimes abdominal pain is serious. Your health care provider will do a medical history and a physical exam to try to determine the cause of your abdominal pain. Follow these instructions at home:  Take over-the-counter and prescription medicines only as told by your health care provider. Do not take a laxative unless told by your health care provider.  Drink enough fluid to keep your urine clear or pale yellow.  Watch your condition for any changes.  Keep all follow-up visits as told by your health care provider. This is important. Contact a health care provider if:  Your abdominal pain changes or gets worse.  You are not hungry or you lose weight without trying.  You are constipated or have diarrhea for more than 2-3 days.  You have pain when you urinate or have a bowel movement.  Your abdominal pain wakes you up at night.  Your pain gets worse with meals, after eating, or with certain foods.  You are throwing up and cannot keep anything down.  You have a fever. Get help right away if:  Your pain does not go away as soon as your health care provider told you to expect.  You cannot stop throwing up.  Your pain is only in areas of the abdomen, such as the right side or the left lower portion of the abdomen.  You have bloody or black stools, or stools that look like tar.  You have severe pain, cramping, or bloating in your abdomen.  You have signs of dehydration, such as: ? Dark urine, very little urine, or no urine. ? Cracked lips. ? Dry mouth. ? Sunken eyes. ? Sleepiness. ? Weakness. This information is not intended to replace advice given to you by your health care provider. Make sure you discuss any questions you have with your health care provider. Document Released: 01/22/2005 Document  Revised: 11/02/2015 Document Reviewed: 09/26/2015 Elsevier Interactive Patient Education  2020 [employer]  

## 2018-11-17 NOTE — Telephone Encounter (Signed)
Preliminary results of MR called to me.  Spoke with radiologist Staffprd (?) who did not think there was an urgent surgical issue.  Called Kylie [name],  She is definitely feeling better today .  History  Consistent with fatty meal causing ejection of gallstone .  labs and MRI consistent with transient bile duct obstruction now resolved . Advised her to go to  ER if pain returns tonight ,  change to fat free diet  And follow up with Kylie [name] for Gen Surg referral.

## 2018-11-18 ENCOUNTER — Telehealth: Payer: Self-pay

## 2018-11-18 ENCOUNTER — Other Ambulatory Visit: Payer: Self-pay | Admitting: Internal Medicine

## 2018-11-18 ENCOUNTER — Other Ambulatory Visit (INDEPENDENT_AMBULATORY_CARE_PROVIDER_SITE_OTHER): Payer: BC Managed Care – PPO

## 2018-11-18 DIAGNOSIS — R7989 Other specified abnormal findings of blood chemistry: Secondary | ICD-10-CM

## 2018-11-18 DIAGNOSIS — K8050 Calculus of bile duct without cholangitis or cholecystitis without obstruction: Secondary | ICD-10-CM | POA: Diagnosis not present

## 2018-11-18 DIAGNOSIS — R945 Abnormal results of liver function studies: Secondary | ICD-10-CM | POA: Diagnosis not present

## 2018-11-18 DIAGNOSIS — R10811 Right upper quadrant abdominal tenderness: Secondary | ICD-10-CM

## 2018-11-18 LAB — HEPATIC FUNCTION PANEL
ALT: 124 U/L — ABNORMAL HIGH (ref 0–35)
AST: 55 U/L — ABNORMAL HIGH (ref 0–37)
Albumin: 4.3 g/dL (ref 3.5–5.2)
Alkaline Phosphatase: 130 U/L — ABNORMAL HIGH (ref 39–117)
Bilirubin, Direct: 0.2 mg/dL (ref 0.0–0.3)
Total Bilirubin: 0.5 mg/dL (ref 0.2–1.2)
Total Protein: 7.3 g/dL (ref 6.0–8.3)

## 2018-11-18 LAB — CBC WITH DIFFERENTIAL/PLATELET
Basophils Absolute: 0.1 10*3/uL (ref 0.0–0.1)
Basophils Relative: 0.9 % (ref 0.0–3.0)
Eosinophils Absolute: 0.1 10*3/uL (ref 0.0–0.7)
Eosinophils Relative: 1.5 % (ref 0.0–5.0)
HCT: 40.3 % (ref 36.0–46.0)
Hemoglobin: 12.8 g/dL (ref 12.0–15.0)
Lymphocytes Relative: 38.6 % (ref 12.0–46.0)
Lymphs Abs: 2.3 10*3/uL (ref 0.7–4.0)
MCHC: 31.7 g/dL (ref 30.0–36.0)
MCV: 88.1 fl (ref 78.0–100.0)
Monocytes Absolute: 0.5 10*3/uL (ref 0.1–1.0)
Monocytes Relative: 7.9 % (ref 3.0–12.0)
Neutro Abs: 3 10*3/uL (ref 1.4–7.7)
Neutrophils Relative %: 51.1 % (ref 43.0–77.0)
Platelets: 248 10*3/uL (ref 150.0–400.0)
RBC: 4.57 Mil/uL (ref 3.87–5.11)
RDW: 14.9 % (ref 11.5–15.5)
WBC: 5.9 10*3/uL (ref 4.0–10.5)

## 2018-11-18 NOTE — Telephone Encounter (Signed)
Spoke with patient and went over results with her again. She has been informed to come back in and get labs done. She is coming in this afternoon for lab redrawl. She also states she has been contacted by surgeon's office and has made appointment to discuss have gallbladder removed.

## 2018-11-18 NOTE — Addendum Note (Signed)
Addended by: Janith Lima on: 11/18/2018 07:55 AM   Modules accepted: Orders

## 2018-11-18 NOTE — Telephone Encounter (Signed)
Copied from [place] [phone]. Topic: General - Other >> Nov 18, 2018  9:30 AM Rainey Pines A wrote: Patient would like a callback from nurse to go over her MRI results because she is confused.

## 2018-11-18 NOTE — Telephone Encounter (Signed)
Patient advised, she is coming in right now for lab

## 2019-07-03 ENCOUNTER — Ambulatory Visit: Payer: Self-pay | Attending: Internal Medicine

## 2019-07-03 DIAGNOSIS — Z23 Encounter for immunization: Secondary | ICD-10-CM | POA: Insufficient documentation

## 2019-07-03 NOTE — Progress Notes (Signed)
   Covid-19 Vaccination Clinic  Name:  KITZIE KEEBLER    MRN: [id number] DOB: 01-07-1966  07/03/2019  Ms. Fanfan was observed post Covid-19 immunization for 15 minutes without incident. She was provided with Vaccine Information Sheet and instruction to access the V-Safe system.   Ms. Podbielski was instructed to call 911 with any severe reactions post vaccine: Marland Kitchen Difficulty breathing  . Swelling of face and throat  . A fast heartbeat  . A bad rash all over body  . Dizziness and weakness   Immunizations Administered    Name Date Dose VIS Date Route   Pfizer COVID-19 Vaccine 07/03/2019 11:39 AM 0.3 mL 04/08/2019 Intramuscular   Manufacturer: [place]   Lot: [id number]   [place]: [id number]

## 2019-08-02 ENCOUNTER — Ambulatory Visit: Payer: Self-pay

## 2020-06-19 ENCOUNTER — Other Ambulatory Visit: Payer: Self-pay

## 2020-06-19 ENCOUNTER — Other Ambulatory Visit: Payer: Self-pay | Admitting: Internal Medicine

## 2020-06-19 DIAGNOSIS — Z1231 Encounter for screening mammogram for malignant neoplasm of breast: Secondary | ICD-10-CM

## 2020-06-22 ENCOUNTER — Encounter: Payer: Self-pay | Admitting: Internal Medicine

## 2020-06-22 ENCOUNTER — Ambulatory Visit (INDEPENDENT_AMBULATORY_CARE_PROVIDER_SITE_OTHER): Payer: 59 | Admitting: Internal Medicine

## 2020-06-22 ENCOUNTER — Other Ambulatory Visit: Payer: Self-pay

## 2020-06-22 VITALS — BP 132/78 | HR 82 | Temp 98.4°F | Resp 18 | Ht 67.0 in | Wt 292.4 lb

## 2020-06-22 DIAGNOSIS — Z0000 Encounter for general adult medical examination without abnormal findings: Secondary | ICD-10-CM

## 2020-06-22 LAB — TSH: TSH: 1.03 u[IU]/mL (ref 0.35–4.50)

## 2020-06-22 LAB — VITAMIN D 25 HYDROXY (VIT D DEFICIENCY, FRACTURES): VITD: 11.33 ng/mL — ABNORMAL LOW (ref 30.00–100.00)

## 2020-06-22 LAB — CBC
HCT: 38.3 % (ref 36.0–46.0)
Hemoglobin: 12.3 g/dL (ref 12.0–15.0)
MCHC: 32.1 g/dL (ref 30.0–36.0)
MCV: 86.1 fl (ref 78.0–100.0)
Platelets: 229 10*3/uL (ref 150.0–400.0)
RBC: 4.45 Mil/uL (ref 3.87–5.11)
RDW: 15 % (ref 11.5–15.5)
WBC: 6.1 10*3/uL (ref 4.0–10.5)

## 2020-06-22 LAB — COMPREHENSIVE METABOLIC PANEL
ALT: 9 U/L (ref 0–35)
AST: 12 U/L (ref 0–37)
Albumin: 3.8 g/dL (ref 3.5–5.2)
Alkaline Phosphatase: 59 U/L (ref 39–117)
BUN: 10 mg/dL (ref 6–23)
CO2: 29 mEq/L (ref 19–32)
Calcium: 9 mg/dL (ref 8.4–10.5)
Chloride: 107 mEq/L (ref 96–112)
Creatinine, Ser: 0.83 mg/dL (ref 0.40–1.20)
GFR: 79.16 mL/min (ref >60.00)
Glucose, Bld: 69 mg/dL — ABNORMAL LOW (ref 70–99)
Potassium: 4.2 mEq/L (ref 3.5–5.1)
Sodium: 140 mEq/L (ref 135–145)
Total Bilirubin: 0.4 mg/dL (ref 0.2–1.2)
Total Protein: 6.4 g/dL (ref 6.0–8.3)

## 2020-06-22 LAB — LIPID PANEL
Cholesterol: 172 mg/dL (ref 0–200)
HDL: 54.9 mg/dL (ref >39.00)
LDL Cholesterol: 105 mg/dL — ABNORMAL HIGH (ref 0–99)
NonHDL: 117.16
Total CHOL/HDL Ratio: 3
Triglycerides: 63 mg/dL (ref 0.0–149.0)
VLDL: 12.6 mg/dL (ref 0.0–40.0)

## 2020-06-22 LAB — VITAMIN B12: Vitamin B-12: 253 pg/mL (ref 211–911)

## 2020-06-22 LAB — HEMOGLOBIN A1C: Hgb A1c MFr Bld: 5.9 % (ref 4.6–6.5)

## 2020-06-22 NOTE — Assessment & Plan Note (Signed)
Flu shot declines. Covid-19 up to date including booster. Shingrix counseled. Tetanus up to date. Colonoscopy due 2025. Mammogram up to date, pap smear up to date. Counseled about sun safety and mole surveillance. Counseled about the dangers of distracted driving. Given 10 year screening recommendations.

## 2020-06-22 NOTE — Progress Notes (Signed)
   Subjective:   Patient ID: Kylie [name], female    DOB: 03-09-1966, 55 y.o.   MRN: [id number]  HPI The patient is a 55 YO female coming in for physical. She has gained some weight during pandemic and is worried about blood pressure and sugars as there is family history of both problems.   PMH, [facility], social history reviewed and updated.   Review of Systems  Constitutional: Negative.   HENT: Negative.   Eyes: Negative.   Respiratory: Negative for cough, chest tightness and shortness of breath.   Cardiovascular: Negative for chest pain, palpitations and leg swelling.  Gastrointestinal: Negative for abdominal distention, abdominal pain, constipation, diarrhea, nausea and vomiting.  Musculoskeletal: Negative.   Skin: Negative.   Neurological: Positive for headaches.  Psychiatric/Behavioral: Negative.     Objective:  Physical Exam Constitutional:      Appearance: She is well-developed and well-nourished. She is obese.  HENT:     Head: Normocephalic and atraumatic.  Eyes:     Extraocular Movements: EOM normal.  Cardiovascular:     Rate and Rhythm: Normal rate and regular rhythm.  Pulmonary:     Effort: Pulmonary effort is normal. No respiratory distress.     Breath sounds: Normal breath sounds. No wheezing or rales.  Abdominal:     General: Bowel sounds are normal. There is no distension.     Palpations: Abdomen is soft.     Tenderness: There is no abdominal tenderness. There is no rebound.  Musculoskeletal:        General: No edema.     Cervical back: Normal range of motion.  Skin:    General: Skin is warm and dry.  Neurological:     Mental Status: She is alert and oriented to person, place, and time.     Coordination: Coordination normal.  Psychiatric:        Mood and Affect: Mood and affect normal.     Vitals:   06/22/20 0843  BP: 132/78  Pulse: 82  Resp: 18  Temp: 98.4 F (36.9 C)  TempSrc: Oral  SpO2: 100%  Weight: 292 lb 6.4 oz (132.6 kg)  Height: 5\' 7"   (1.702 m)    This visit occurred during the SARS-CoV-2 public health emergency.  Safety protocols were in place, including screening questions prior to the visit, additional usage of staff PPE, and extensive cleaning of exam room while observing appropriate contact time as indicated for disinfecting solutions.   Assessment & Plan:

## 2020-06-22 NOTE — Assessment & Plan Note (Signed)
Checking for complications of weight with labs including vitamin, cholesterol, HgA1c.

## 2020-06-22 NOTE — Patient Instructions (Addendum)
We are checking the blood work today and will call you back about the results.    Health Maintenance, Female Adopting a healthy lifestyle and getting preventive care are important in promoting health and wellness. Ask your health care provider about:  The right schedule for you to have regular tests and exams.  Things you can do on your own to prevent diseases and keep yourself healthy. What should I know about diet, weight, and exercise? Eat a healthy diet  Eat a diet that includes plenty of vegetables, fruits, low-fat dairy products, and lean protein.  Do not eat a lot of foods that are high in solid fats, added sugars, or sodium.   Maintain a healthy weight Body mass index (BMI) is used to identify weight problems. It estimates body fat based on height and weight. Your health care provider can help determine your BMI and help you achieve or maintain a healthy weight. Get regular exercise Get regular exercise. This is one of the most important things you can do for your health. Most adults should:  Exercise for at least 150 minutes each week. The exercise should increase your heart rate and make you sweat (moderate-intensity exercise).  Do strengthening exercises at least twice a week. This is in addition to the moderate-intensity exercise.  Spend less time sitting. Even light physical activity can be beneficial. Watch cholesterol and blood lipids Have your blood tested for lipids and cholesterol at 56 years of age, then have this test every 5 years. Have your cholesterol levels checked more often if:  Your lipid or cholesterol levels are high.  You are older than 56 years of age.  You are at high risk for heart disease. What should I know about cancer screening? Depending on your health history and family history, you may need to have cancer screening at various ages. This may include screening for:  Breast cancer.  Cervical cancer.  Colorectal cancer.  Skin  cancer.  Lung cancer. What should I know about heart disease, diabetes, and high blood pressure? Blood pressure and heart disease  High blood pressure causes heart disease and increases the risk of stroke. This is more likely to develop in people who have high blood pressure readings, are of African descent, or are overweight.  Have your blood pressure checked: ? Every 3-5 years if you are 32-41 years of age. ? Every year if you are 9 years old or older. Diabetes Have regular diabetes screenings. This checks your fasting blood sugar level. Have the screening done:  Once every three years after age 41 if you are at a normal weight and have a low risk for diabetes.  More often and at a younger age if you are overweight or have a high risk for diabetes. What should I know about preventing infection? Hepatitis B If you have a higher risk for hepatitis B, you should be screened for this virus. Talk with your health care provider to find out if you are at risk for hepatitis B infection. Hepatitis C Testing is recommended for:  Everyone born from [birth year] through 1966.  Anyone with known risk factors for hepatitis C. Sexually transmitted infections (STIs)  Get screened for STIs, including gonorrhea and chlamydia, if: ? You are sexually active and are younger than 56 years of age. ? You are older than 56 years of age and your health care provider tells you that you are at risk for this type of infection. ? Your sexual activity has changed since you  were last screened, and you are at increased risk for chlamydia or gonorrhea. Ask your health care provider if you are at risk.  Ask your health care provider about whether you are at high risk for HIV. Your health care provider may recommend a prescription medicine to help prevent HIV infection. If you choose to take medicine to prevent HIV, you should first get tested for HIV. You should then be tested every 3 months for as long as you are taking  the medicine. Pregnancy  If you are about to stop having your period (premenopausal) and you may become pregnant, seek counseling before you get pregnant.  Take 400 to 800 micrograms (mcg) of folic acid every day if you become pregnant.  Ask for birth control (contraception) if you want to prevent pregnancy. Osteoporosis and menopause Osteoporosis is a disease in which the bones lose minerals and strength with aging. This can result in bone fractures. If you are 23 years old or older, or if you are at risk for osteoporosis and fractures, ask your health care provider if you should:  Be screened for bone loss.  Take a calcium or vitamin D supplement to lower your risk of fractures.  Be given hormone replacement therapy (HRT) to treat symptoms of menopause. Follow these instructions at home: Lifestyle  Do not use any products that contain nicotine or tobacco, such as cigarettes, e-cigarettes, and chewing tobacco. If you need help quitting, ask your health care provider.  Do not use street drugs.  Do not share needles.  Ask your health care provider for help if you need support or information about quitting drugs. Alcohol use  Do not drink alcohol if: ? Your health care provider tells you not to drink. ? You are pregnant, may be pregnant, or are planning to become pregnant.  If you drink alcohol: ? Limit how much you use to 0-1 drink a day. ? Limit intake if you are breastfeeding.  Be aware of how much alcohol is in your drink. In the [place] one drink equals one 12 oz bottle of beer (355 mL), one 5 oz glass of wine (148 mL), or one 1 oz glass of hard liquor (44 mL). General instructions  Schedule regular health, dental, and eye exams.  Stay current with your vaccines.  Tell your health care provider if: ? You often feel depressed. ? You have ever been abused or do not feel safe at home. Summary  Adopting a healthy lifestyle and getting preventive care are important in  promoting health and wellness.  Follow your health care provider's instructions about healthy diet, exercising, and getting tested or screened for diseases.  Follow your health care provider's instructions on monitoring your cholesterol and blood pressure. This information is not intended to replace advice given to you by your health care provider. Make sure you discuss any questions you have with your health care provider. Document Revised: 04/07/2018 Document Reviewed: 04/07/2018 Elsevier Patient Education  2021 [employer].

## 2020-06-26 ENCOUNTER — Other Ambulatory Visit: Payer: Self-pay | Admitting: Internal Medicine

## 2020-06-26 MED ORDER — VITAMIN D (ERGOCALCIFEROL) 1.25 MG (50000 UNIT) PO CAPS: 50000.0000 [IU] | ORAL_CAPSULE | ORAL | 0 refills | Status: DC

## 2020-07-12 ENCOUNTER — Telehealth: Payer: Self-pay | Admitting: Internal Medicine

## 2020-07-12 NOTE — Telephone Encounter (Signed)
Patient called and was wondering if Dr. Sharlet Salina could write a prescription for Phentermin. She can be reached at [phone]. Please advise

## 2020-07-12 NOTE — Telephone Encounter (Signed)
See below

## 2020-07-13 NOTE — Telephone Encounter (Signed)
Spoke with the patient and she has been scheduled to see Dr. Sharlet Salina on 07/17/2020 at 9:20 am. Patient is aware of appointment date and time. No other questions or concerns at this time.

## 2020-07-13 NOTE — Telephone Encounter (Signed)
We typically do not prescribe this medication as it is not safe long term. She can make visit to discuss options for weight.

## 2020-07-17 ENCOUNTER — Ambulatory Visit: Payer: 59 | Admitting: Internal Medicine

## 2020-07-17 DIAGNOSIS — Z0289 Encounter for other administrative examinations: Secondary | ICD-10-CM

## 2020-08-08 ENCOUNTER — Inpatient Hospital Stay: Admission: RE | Admit: 2020-08-08 | Payer: Self-pay | Source: Ambulatory Visit

## 2020-09-16 ENCOUNTER — Other Ambulatory Visit: Payer: Self-pay | Admitting: Internal Medicine

## 2021-02-12 IMAGING — US ULTRASOUND ABDOMEN LIMITED
1 series · 14 of 25 positions shown · non-contrast
Comparison: Abdominal series 11/17/2018.

CLINICAL DATA: Right upper quadrant pain.

EXAM:
ULTRASOUND ABDOMEN LIMITED RIGHT UPPER QUADRANT

[Series 1: ultrasound abdomen limited · 0.19mm/px · 14 of 46 slices shown]
[im 1/46]
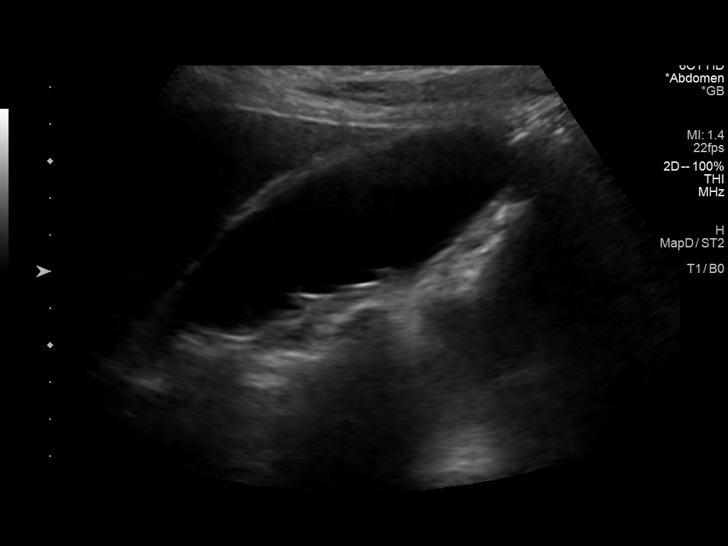
[im 4/46]
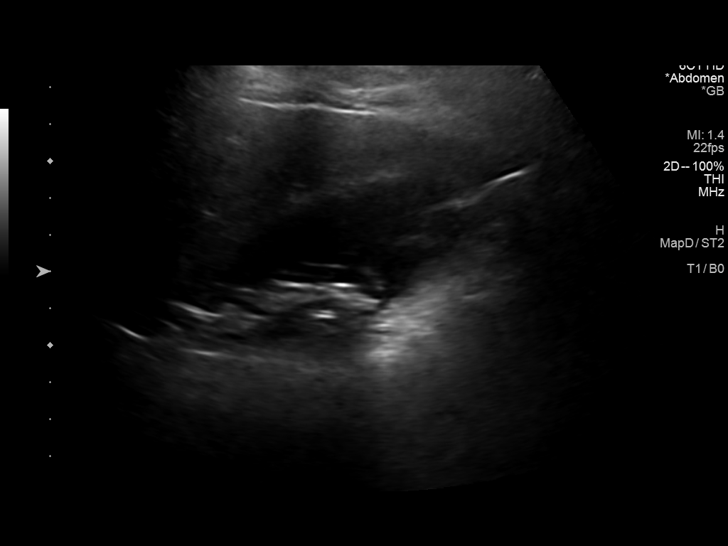
[im 8/46]
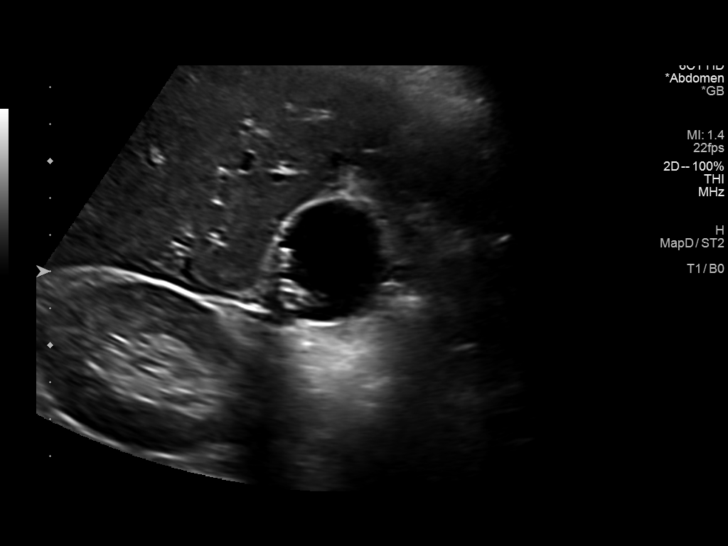
[im 12/46]
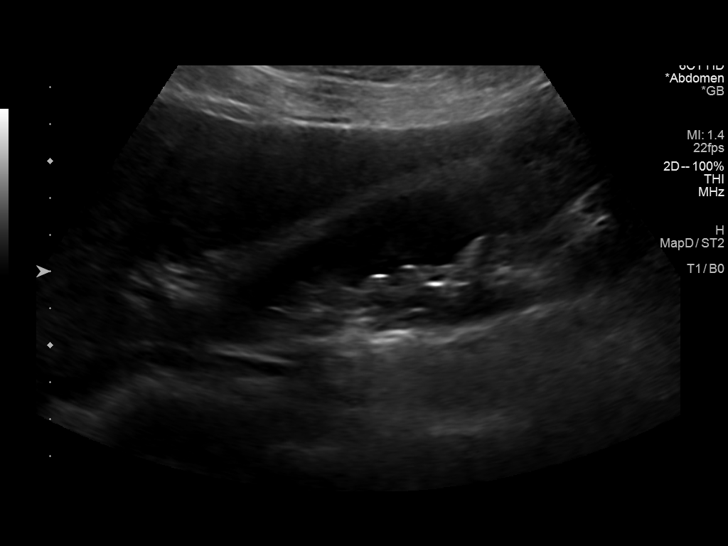
[im 16/46]
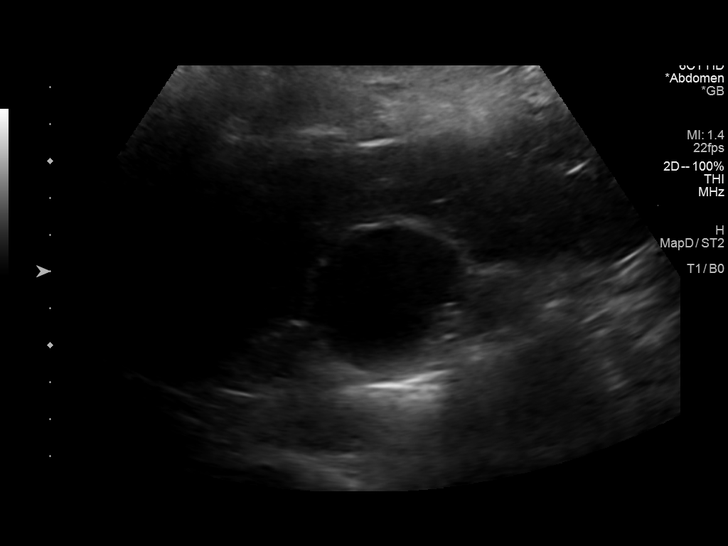
[im 17/46]
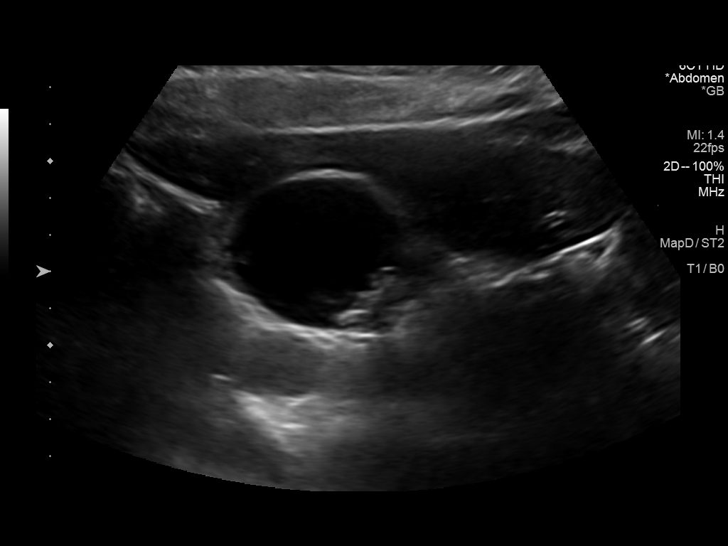
[im 21/46]
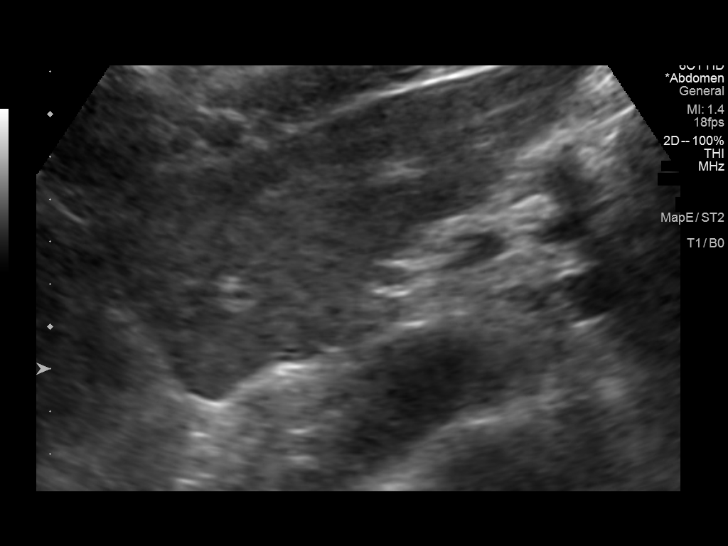
[im 25/46]
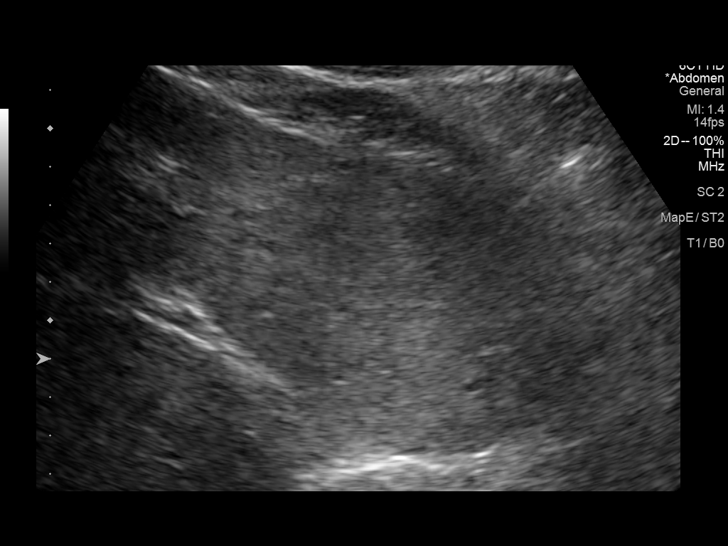
[im 29/46]
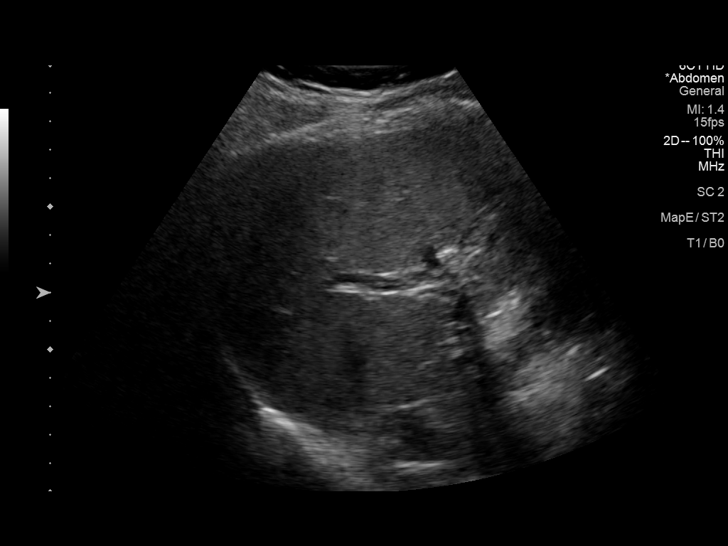
[im 31/46]
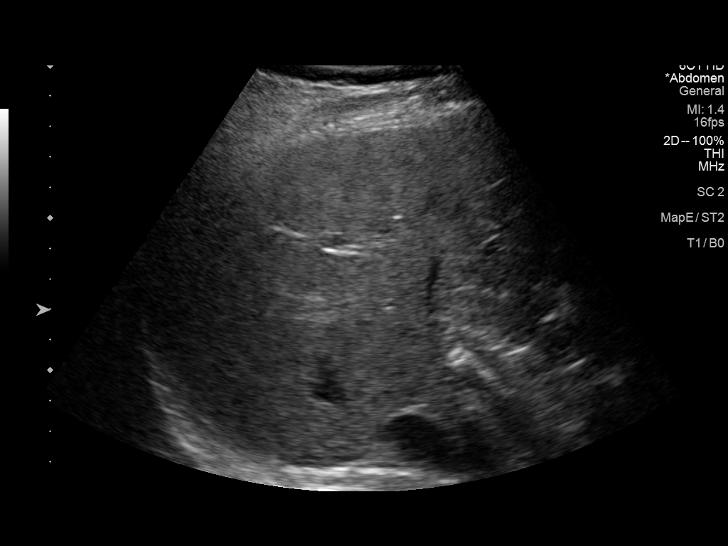
[im 34/46]
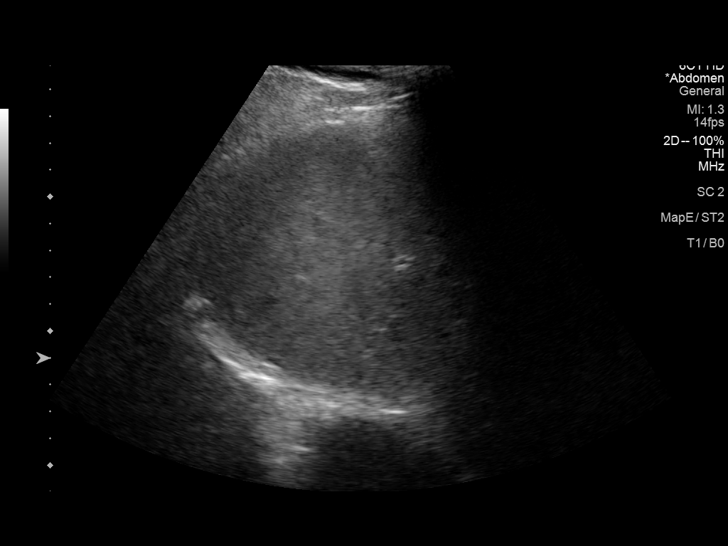
[im 38/46]
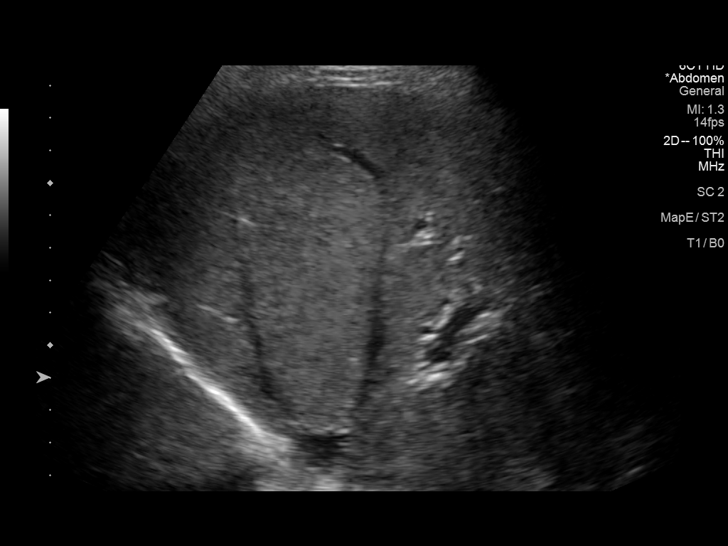
[im 42/46]
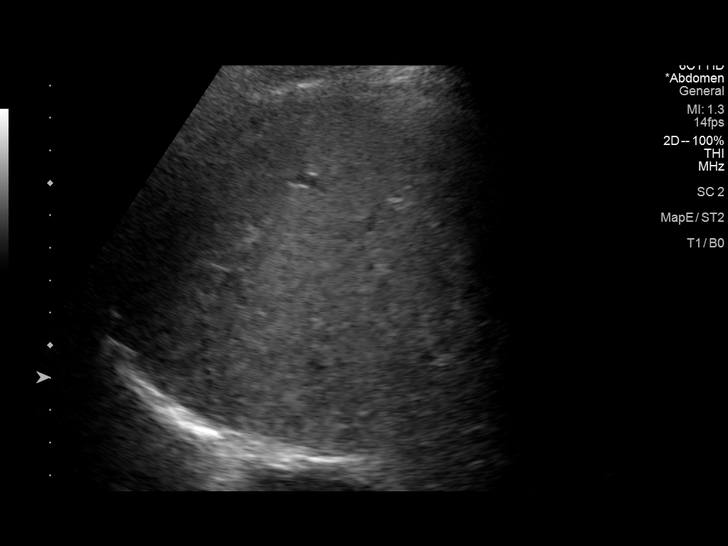
[im 46/46]
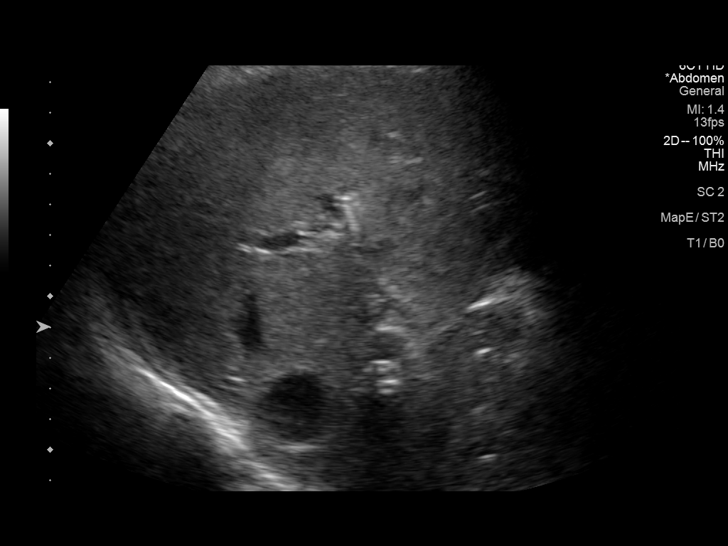

[14 of 25 positions shown; findings below may reference images not displayed]

FINDINGS: Gallbladder:

Gallstones noted. Gallbladder wall thickness 2.2 mm. Negative Murphy
sign. Multiple gallstones are noted, the largest measures 1.5 cm.

Common bile duct:

Diameter: 7.5 mm

Liver:

No focal lesion identified. Within normal limits in parenchymal
echogenicity. Portal vein is patent on color Doppler imaging with
normal direction of blood flow towards the liver.
IMPRESSION: 1.  Multiple gallstones.  No evidence of cholecystitis.

2. Slightly prominent common bile duct at 7.5 mm. Although no
obstructing abnormality identified, common bile duct obstruction
cannot be excluded.

## 2021-08-19 ENCOUNTER — Encounter: Payer: Self-pay | Admitting: Internal Medicine

## 2021-08-19 ENCOUNTER — Other Ambulatory Visit: Payer: Self-pay | Admitting: Internal Medicine

## 2021-08-19 ENCOUNTER — Ambulatory Visit (INDEPENDENT_AMBULATORY_CARE_PROVIDER_SITE_OTHER): Payer: Commercial Managed Care - PPO | Admitting: Internal Medicine

## 2021-08-19 VITALS — BP 124/80 | HR 76 | Resp 18 | Ht 67.0 in | Wt 293.8 lb

## 2021-08-19 DIAGNOSIS — E559 Vitamin D deficiency, unspecified: Secondary | ICD-10-CM | POA: Diagnosis not present

## 2021-08-19 DIAGNOSIS — Z0000 Encounter for general adult medical examination without abnormal findings: Secondary | ICD-10-CM | POA: Diagnosis not present

## 2021-08-19 DIAGNOSIS — Z23 Encounter for immunization: Secondary | ICD-10-CM

## 2021-08-19 LAB — COMPREHENSIVE METABOLIC PANEL
ALT: 15 U/L (ref 0–35)
AST: 18 U/L (ref 0–37)
Albumin: 4.3 g/dL (ref 3.5–5.2)
Alkaline Phosphatase: 64 U/L (ref 39–117)
BUN: 10 mg/dL (ref 6–23)
CO2: 27 mEq/L (ref 19–32)
Calcium: 9.2 mg/dL (ref 8.4–10.5)
Chloride: 104 mEq/L (ref 96–112)
Creatinine, Ser: 0.88 mg/dL (ref 0.40–1.20)
GFR: 73.19 mL/min (ref >60.00)
Glucose, Bld: 88 mg/dL (ref 70–99)
Potassium: 4.2 mEq/L (ref 3.5–5.1)
Sodium: 138 mEq/L (ref 135–145)
Total Bilirubin: 0.4 mg/dL (ref 0.2–1.2)
Total Protein: 7.3 g/dL (ref 6.0–8.3)

## 2021-08-19 LAB — VITAMIN B12: Vitamin B-12: 293 pg/mL (ref 211–911)

## 2021-08-19 LAB — CBC
HCT: 39.1 % (ref 36.0–46.0)
Hemoglobin: 12.6 g/dL (ref 12.0–15.0)
MCHC: 32.1 g/dL (ref 30.0–36.0)
MCV: 86.2 fl (ref 78.0–100.0)
Platelets: 248 10*3/uL (ref 150.0–400.0)
RBC: 4.54 Mil/uL (ref 3.87–5.11)
RDW: 14.9 % (ref 11.5–15.5)
WBC: 6.4 10*3/uL (ref 4.0–10.5)

## 2021-08-19 LAB — LIPID PANEL
Cholesterol: 185 mg/dL (ref 0–200)
HDL: 60.1 mg/dL (ref >39.00)
LDL Cholesterol: 106 mg/dL — ABNORMAL HIGH (ref 0–99)
NonHDL: 124.95
Total CHOL/HDL Ratio: 3
Triglycerides: 96 mg/dL (ref 0.0–149.0)
VLDL: 19.2 mg/dL (ref 0.0–40.0)

## 2021-08-19 LAB — HEMOGLOBIN A1C: Hgb A1c MFr Bld: 6 % (ref 4.6–6.5)

## 2021-08-19 LAB — VITAMIN D 25 HYDROXY (VIT D DEFICIENCY, FRACTURES): VITD: 25.84 ng/mL — ABNORMAL LOW (ref 30.00–100.00)

## 2021-08-19 MED ORDER — SEMAGLUTIDE-WEIGHT MANAGEMENT 1.7 MG/0.75ML ~~LOC~~ SOAJ: 1.7000 mg | SUBCUTANEOUS | 0 refills | Status: DC

## 2021-08-19 MED ORDER — SEMAGLUTIDE-WEIGHT MANAGEMENT 2.4 MG/0.75ML ~~LOC~~ SOAJ: 2.4000 mg | SUBCUTANEOUS | 0 refills | Status: DC

## 2021-08-19 MED ORDER — SEMAGLUTIDE-WEIGHT MANAGEMENT 0.5 MG/0.5ML ~~LOC~~ SOAJ: 0.5000 mg | SUBCUTANEOUS | 0 refills | Status: DC

## 2021-08-19 MED ORDER — SEMAGLUTIDE-WEIGHT MANAGEMENT 1 MG/0.5ML ~~LOC~~ SOAJ: 1.0000 mg | SUBCUTANEOUS | 0 refills | Status: DC

## 2021-08-19 MED ORDER — SEMAGLUTIDE-WEIGHT MANAGEMENT 0.25 MG/0.5ML ~~LOC~~ SOAJ: 0.2500 mg | SUBCUTANEOUS | 0 refills | Status: DC

## 2021-08-19 NOTE — Patient Instructions (Signed)
We have sent in the wegovy to take for the weight. ? ? ?

## 2021-08-19 NOTE — Assessment & Plan Note (Signed)
Flu shot yearly. Covid-19 counseled. Shingrix 1st given today. Tetanus up to date. Colonoscopy up to date. Mammogram ordered, pap smear not indicated. Counseled about sun safety and mole surveillance. Counseled about the dangers of distracted driving. Given 10 year screening recommendations.  ? ?

## 2021-08-19 NOTE — Assessment & Plan Note (Signed)
Previous vitamin D level low and did high dose replacement last year. Levels not rechecked and will check today. Treat as appropriate.  ?

## 2021-08-19 NOTE — Progress Notes (Signed)
? ?  Subjective:  ? ?Patient ID: Kylie [name], female    DOB: 06-14-1965, 56 y.o.   MRN: [id number] ? ?HPI ?The patient is a 56 YO female coming in for concerns about weight loss. Also desires physical.  ? ?PMH, [facility], social history reviewed and updated ? ?Review of Systems  ?Constitutional: Negative.   ?HENT: Negative.    ?Eyes: Negative.   ?Respiratory:  Negative for cough, chest tightness and shortness of breath.   ?Cardiovascular:  Negative for chest pain, palpitations and leg swelling.  ?Gastrointestinal:  Negative for abdominal distention, abdominal pain, constipation, diarrhea, nausea and vomiting.  ?Musculoskeletal: Negative.   ?Skin: Negative.   ?Neurological: Negative.   ?Psychiatric/Behavioral: Negative.    ? ?Objective:  ?Physical Exam ?Constitutional:   ?   Appearance: She is well-developed. She is obese.  ?HENT:  ?   Head: Normocephalic and atraumatic.  ?Cardiovascular:  ?   Rate and Rhythm: Normal rate and regular rhythm.  ?Pulmonary:  ?   Effort: Pulmonary effort is normal. No respiratory distress.  ?   Breath sounds: Normal breath sounds. No wheezing or rales.  ?Abdominal:  ?   General: Bowel sounds are normal. There is no distension.  ?   Palpations: Abdomen is soft.  ?   Tenderness: There is no abdominal tenderness. There is no rebound.  ?Musculoskeletal:  ?   Cervical back: Normal range of motion.  ?Skin: ?   General: Skin is warm and dry.  ?Neurological:  ?   Mental Status: She is alert and oriented to person, place, and time.  ?   Coordination: Coordination normal.  ? ? ?Vitals:  ? 08/19/21 1257  ?BP: 124/80  ?Pulse: 76  ?Resp: 18  ?SpO2: 95%  ?Weight: 293 lb 12.8 oz (133.3 kg)  ?Height: '5\' 7"'$  (1.702 m)  ? ? ?This visit occurred during the SARS-CoV-2 public health emergency.  Safety protocols were in place, including screening questions prior to the visit, additional usage of staff PPE, and extensive cleaning of exam room while observing appropriate contact time as indicated for disinfecting  solutions.  ? ?Assessment & Plan:  ?Shingrix IM given at visit ?

## 2021-08-19 NOTE — Assessment & Plan Note (Signed)
Checking for complications with QBH4L, vitamin D. After counseling about options we elected to do wegovy. Rx done for titration schedule to get to maximum dose and will need follow up 3-6 months.  ?

## 2021-08-19 NOTE — Telephone Encounter (Signed)
Rec'd msg stating " Script Clarification:NOT COVERED.".Marland Kitchenlmb ?

## 2021-08-20 ENCOUNTER — Other Ambulatory Visit: Payer: Self-pay | Admitting: Internal Medicine

## 2021-08-21 ENCOUNTER — Telehealth: Payer: Self-pay | Admitting: Internal Medicine

## 2021-08-21 NOTE — Telephone Encounter (Signed)
Pt states she was informed by pharmacy Semaglutide-Weight Management 0.25 MG/0.5ML SOAJ needs PA ? ?Pt requesting a cb w/ a status update  ?

## 2021-08-22 ENCOUNTER — Telehealth: Payer: Self-pay

## 2021-08-22 NOTE — Telephone Encounter (Signed)
Pt called back for results. I advised pt of Dr. Sharlet Salina result note states The sugars are pre-diabetes so we could have her talk to a nutritionist if she wants to help make dietary changes to reduce carbohydrates and foods in food/drink to avoid progression to diabetes. Cholesterol good. Vitamin D is better. B12 level is on the low end of normal make sure to take B12 1000 mcg daily over the counter.  ? ?Pt understood and had no further questions ? ?

## 2021-08-22 NOTE — Telephone Encounter (Signed)
Noted  

## 2021-08-23 ENCOUNTER — Other Ambulatory Visit: Payer: Self-pay | Admitting: Internal Medicine

## 2021-08-23 NOTE — Telephone Encounter (Signed)
Rec'd msg NOT COVERED ON INSURANCE. Requesting med to be change../l,b ?

## 2021-08-23 NOTE — Telephone Encounter (Signed)
Pt has stated she is calling to check if the PA has been started for  Semaglutide-Weight Management 0.25 MG/0.5ML SOAJ ?  ?

## 2021-08-23 NOTE — Telephone Encounter (Signed)
If PA denied could do metformin since she has pre-diabetes to help with weight. ?

## 2021-08-27 ENCOUNTER — Telehealth: Payer: Self-pay | Admitting: Internal Medicine

## 2021-08-27 NOTE — Telephone Encounter (Signed)
Prior authorization was denied. Appeal has been completed on covermymeds. Patient has been made aware of updates. No questions or concerns at this time.  ?

## 2021-08-27 NOTE — Telephone Encounter (Signed)
Pt requesting a cb for  status update regarding  Semaglutide-Weight Management 0.25 MG/0.5ML SOAJ ? PA ?

## 2021-08-27 NOTE — Telephone Encounter (Signed)
Patient would like you to call her concerning her prior authorization  ?

## 2021-08-27 NOTE — Telephone Encounter (Signed)
Prior authorization was denied. Appeal has been initiated on covermymeds. Patient has been made aware of the progress. No other questions or concerns.  ?

## 2021-09-05 NOTE — Telephone Encounter (Signed)
Pt is calling for update on PA Appeal. ? ?Please advise ?

## 2021-09-06 ENCOUNTER — Telehealth: Payer: Self-pay

## 2021-09-06 NOTE — Telephone Encounter (Signed)
Noted  

## 2021-09-06 NOTE — Telephone Encounter (Signed)
See other phone note

## 2021-09-06 NOTE — Telephone Encounter (Signed)
Other oral options would be contrave, qsymia and other injection saxenda. ?

## 2021-09-06 NOTE — Telephone Encounter (Signed)
Pt called to see what other options were available since insurance denied the injections. I advised pt of Dr. Nathanial Millman other suggestions  of contrave, qsymia and other injection saxenda. ? ?I advised pt to call insurance to see if they cover either of those and if not to see what drug in that class of medication if any would be covered and give [place] a call back. ? ?Pt was pleasant when disconnecting the call agreeing to follow the recommendation. ? ?FYI ?

## 2021-09-06 NOTE — Telephone Encounter (Signed)
Spoke with the patient to let her know that her appeal was denied due to her not trying and failing other medications. Patient would like to know what her medication options are ?

## 2021-09-09 ENCOUNTER — Telehealth: Payer: Self-pay | Admitting: Internal Medicine

## 2021-09-09 NOTE — Telephone Encounter (Signed)
Pt called in and wants to know if she can be rx Metformin & Phentermine 37.5 mg?  ? ? ?[id number] [place] [address] [place]  [phone] ?Fax:  [phone] ?  ? ?

## 2021-09-09 NOTE — Telephone Encounter (Signed)
Phentermine is not safe but metformin would be indicated with her pre-diabetes. Would she like to start this? ?

## 2021-09-10 NOTE — Telephone Encounter (Signed)
Spoke with the patient and she is willing to start metformin but she is also wanting to take a appetite suppressant in addition to the metformin.   ?

## 2021-09-11 NOTE — Telephone Encounter (Signed)
We could offer contrave or qsmyia which are safe appetite suppressants.  ?

## 2021-09-12 ENCOUNTER — Other Ambulatory Visit: Payer: Self-pay | Admitting: Internal Medicine

## 2021-09-12 MED ORDER — CONTRAVE 8-90 MG PO TB12: ORAL_TABLET | ORAL | 0 refills | Status: DC

## 2021-09-12 MED ORDER — METFORMIN HCL 500 MG PO TABS: 500.0000 mg | ORAL_TABLET | Freq: Two times a day (BID) | ORAL | 3 refills | Status: DC

## 2021-09-12 NOTE — Telephone Encounter (Signed)
Spoke with the patient and she would like to take try contrave

## 2021-09-12 NOTE — Telephone Encounter (Signed)
Sent in metformin and contrave.

## 2021-09-18 NOTE — Telephone Encounter (Signed)
Alternative Requested:NOT COVERED.../l,mb

## 2022-01-02 NOTE — Telephone Encounter (Signed)
NOTE NOT NEEDED ?

## 2022-05-09 ENCOUNTER — Encounter: Payer: Self-pay | Admitting: Internal Medicine

## 2022-05-09 ENCOUNTER — Other Ambulatory Visit: Payer: Self-pay | Admitting: Internal Medicine

## 2022-05-09 ENCOUNTER — Ambulatory Visit (INDEPENDENT_AMBULATORY_CARE_PROVIDER_SITE_OTHER): Payer: 59 | Admitting: Internal Medicine

## 2022-05-09 MED ORDER — TIRZEPATIDE 10 MG/0.5ML ~~LOC~~ SOAJ: 10.0000 mg | SUBCUTANEOUS | 6 refills | Status: DC

## 2022-05-09 MED ORDER — PEN NEEDLES 32G X 4 MM MISC: 0 refills | Status: AC

## 2022-05-09 NOTE — Patient Instructions (Signed)
We have sent in the mounjaro 10 mg weekly to help with the weight loss.

## 2022-05-09 NOTE — Assessment & Plan Note (Signed)
Has done well with mounjaro and is currently on 7.5 mg weekly. We will increase to 10 mg weekly new rx done today as well as for pen needles.

## 2022-05-09 NOTE — Progress Notes (Signed)
   Subjective:   Patient ID: Kylie [name], female    DOB: 03/05/1966, 57 y.o.   MRN: [id number]  HPI The patient is a 57 YO female coming in for weight concerns  Review of Systems  Constitutional: Negative.   HENT: Negative.    Eyes: Negative.   Respiratory:  Negative for cough, chest tightness and shortness of breath.   Cardiovascular:  Negative for chest pain, palpitations and leg swelling.  Gastrointestinal:  Negative for abdominal distention, abdominal pain, constipation, diarrhea, nausea and vomiting.  Musculoskeletal: Negative.   Skin: Negative.   Neurological: Negative.   Psychiatric/Behavioral: Negative.      Objective:  Physical Exam Constitutional:      Appearance: She is well-developed. She is obese.  HENT:     Head: Normocephalic and atraumatic.  Cardiovascular:     Rate and Rhythm: Normal rate and regular rhythm.  Pulmonary:     Effort: Pulmonary effort is normal. No respiratory distress.     Breath sounds: Normal breath sounds. No wheezing or rales.  Abdominal:     General: Bowel sounds are normal. There is no distension.     Palpations: Abdomen is soft.     Tenderness: There is no abdominal tenderness. There is no rebound.  Musculoskeletal:     Cervical back: Normal range of motion.  Skin:    General: Skin is warm and dry.  Neurological:     Mental Status: She is alert and oriented to person, place, and time.     Coordination: Coordination normal.     Vitals:   05/09/22 1518  BP: 130/80  Pulse: 94  Temp: 98.7 F (37.1 C)  TempSrc: Oral  SpO2: 97%  Weight: 248 lb 9.6 oz (112.8 kg)  Height: '5\' 7"'$  (1.702 m)    Assessment & Plan:

## 2022-05-12 NOTE — Telephone Encounter (Signed)
Rec'd note..THIS IS NOT COVERED PLEASE SEND ALTERNATIVE OR CONTACT THE INSURANCE FOR PRIOR [place]. Or proceed w/ PA

## 2022-05-19 ENCOUNTER — Telehealth: Payer: Self-pay | Admitting: Internal Medicine

## 2022-05-19 NOTE — Telephone Encounter (Signed)
Pt's ins states needs a PA before they will fill her tirzepatide [facility]) 10 MG/0.5ML Pen.   Pt gave a phone number to call to get the PA started.  Please call: [phone]

## 2022-05-20 ENCOUNTER — Other Ambulatory Visit (HOSPITAL_COMMUNITY): Payer: Self-pay

## 2022-05-20 NOTE — Telephone Encounter (Signed)
Pharmacy Patient Advocate Encounter   Received notification from [facility] that prior authorization for Mounjaro '10mg'$ /0.11m is required/requested.  Per Test Claim: Product/service not covered. Non-formulary drug Prior auth required.   PA submitted on 05/20/22 to (ins) [employer] via [employer] PA Case ID: [id number] is pending

## 2022-05-21 ENCOUNTER — Other Ambulatory Visit (HOSPITAL_COMMUNITY): Payer: Self-pay

## 2022-05-26 ENCOUNTER — Telehealth: Payer: Self-pay

## 2022-05-26 NOTE — Telephone Encounter (Signed)
Patient was denied.

## 2022-05-26 NOTE — Telephone Encounter (Signed)
Pharmacy Patient Advocate Encounter  Received notification from [facility] that the request for prior authorization for Kylie [name] has been denied due to .    Please be advised we currently do not have a Pharmacist to review denials, therefore you will need to process appeals accordingly as needed. Thanks for your support at this time.   You may call [phone] or fax [phone], to appeal.

## 2022-06-02 NOTE — Telephone Encounter (Signed)
PT calls today in regards to the PA denial. I informed PT that it seemed to be denied to do no past use of medications within the same drug class. PT stated all she had taken in the past has been the metformin and she wasn't sure if this would count.  PT wants to know what would be suggested for her in the future? Would she need to try out the trulicity? How long would insurance require her to be on it to see if it is effective?  CB: [phone]

## 2022-06-04 NOTE — Telephone Encounter (Signed)
Called patient and left voicemail for patient to give our office a call back.

## 2022-06-04 NOTE — Telephone Encounter (Signed)
Does she want to try victoza or trulicity? I am happy to prescribe.

## 2022-06-12 NOTE — Telephone Encounter (Signed)
Patient has been scheduled to come in to discuss about her denial and wanted to be further more advised about Trulicity and victoza

## 2022-06-12 NOTE — Telephone Encounter (Signed)
Patient called back and would like a nurse to return her call at [phone].

## 2022-06-23 ENCOUNTER — Encounter: Payer: Self-pay | Admitting: Internal Medicine

## 2022-06-23 ENCOUNTER — Ambulatory Visit (INDEPENDENT_AMBULATORY_CARE_PROVIDER_SITE_OTHER): Payer: 59 | Admitting: Internal Medicine

## 2022-06-23 VITALS — BP 120/80 | HR 70 | Temp 98.1°F | Ht 67.0 in | Wt 248.0 lb

## 2022-06-23 DIAGNOSIS — R7303 Prediabetes: Secondary | ICD-10-CM | POA: Insufficient documentation

## 2022-06-23 MED ORDER — ZEPBOUND 10 MG/0.5ML ~~LOC~~ SOAJ: 10.0000 mg | SUBCUTANEOUS | 6 refills | Status: DC

## 2022-06-23 NOTE — Patient Instructions (Signed)
We have sent in zepbound which is the weight loss equivalent to mounjaro at the same 10 mg weekly.

## 2022-06-23 NOTE — Progress Notes (Unsigned)
   Subjective:   Patient ID: Kylie [name], female    DOB: 1965-06-28, 57 y.o.   MRN: [id number]  HPI The patient is a 57 YO female coming in for denial of mounjaro. Taking for weight loss only prior to zepbound release.   Review of Systems  Constitutional:  Positive for unexpected weight change.  HENT: Negative.    Eyes: Negative.   Respiratory:  Negative for cough, chest tightness and shortness of breath.   Cardiovascular:  Negative for chest pain, palpitations and leg swelling.  Gastrointestinal:  Negative for abdominal distention, abdominal pain, constipation, diarrhea, nausea and vomiting.  Musculoskeletal: Negative.   Skin: Negative.   Neurological: Negative.   Psychiatric/Behavioral: Negative.      Objective:  Physical Exam Constitutional:      Appearance: She is well-developed. She is obese.  HENT:     Head: Normocephalic and atraumatic.  Cardiovascular:     Rate and Rhythm: Normal rate and regular rhythm.  Pulmonary:     Effort: Pulmonary effort is normal. No respiratory distress.     Breath sounds: Normal breath sounds. No wheezing or rales.  Abdominal:     General: Bowel sounds are normal. There is no distension.     Palpations: Abdomen is soft.     Tenderness: There is no abdominal tenderness. There is no rebound.  Musculoskeletal:     Cervical back: Normal range of motion.  Skin:    General: Skin is warm and dry.  Neurological:     Mental Status: She is alert and oriented to person, place, and time.     Coordination: Coordination normal.     Vitals:   06/23/22 1611  BP: 120/80  Pulse: 70  Temp: 98.1 F (36.7 C)  TempSrc: Oral  SpO2: (!) 84%  Weight: 248 lb (112.5 kg)  Height: 5' 7"$  (1.702 m)    Assessment & Plan:  Visit time 17 minutes in face to face communication with patient and coordination of care, additional 3 minutes spent in record review, coordination or care, ordering tests, communicating/referring to other healthcare professionals,  documenting in medical records all on the same day of the visit for total time 20 minutes spent on the visit.

## 2022-06-24 ENCOUNTER — Encounter: Payer: Self-pay | Admitting: Internal Medicine

## 2022-06-24 NOTE — Assessment & Plan Note (Signed)
Rx zepbound 10 mg weekly which is next interval she was taking mounjaro 7.5 mg weekly with success. We counseled that she does not have diabetes so that is no longer indicated.

## 2022-06-24 NOTE — Assessment & Plan Note (Signed)
Recent on labs but does not qualify as diabetes.

## 2022-06-26 ENCOUNTER — Ambulatory Visit: Payer: Commercial Managed Care - PPO

## 2022-06-27 ENCOUNTER — Ambulatory Visit
Admission: RE | Admit: 2022-06-27 | Discharge: 2022-06-27 | Disposition: A | Discharge status: Home / Self Care | Payer: 59 | Source: Ambulatory Visit | Attending: Internal Medicine | Admitting: Internal Medicine

## 2022-06-27 DIAGNOSIS — Z0000 Encounter for general adult medical examination without abnormal findings: Secondary | ICD-10-CM

## 2022-06-27 DIAGNOSIS — Z1231 Encounter for screening mammogram for malignant neoplasm of breast: Secondary | ICD-10-CM | POA: Diagnosis not present

## 2022-07-02 ENCOUNTER — Encounter: Payer: Self-pay | Admitting: Internal Medicine

## 2022-07-02 LAB — HM MAMMOGRAPHY

## 2022-07-18 ENCOUNTER — Ambulatory Visit: Payer: 59 | Admitting: Internal Medicine

## 2022-07-28 ENCOUNTER — Other Ambulatory Visit: Payer: Self-pay

## 2022-07-28 ENCOUNTER — Ambulatory Visit (INDEPENDENT_AMBULATORY_CARE_PROVIDER_SITE_OTHER): Payer: 59 | Admitting: Internal Medicine

## 2022-07-28 ENCOUNTER — Encounter: Payer: Self-pay | Admitting: Internal Medicine

## 2022-07-28 VITALS — BP 118/68 | HR 76 | Temp 98.0°F | Ht 67.0 in | Wt 240.0 lb

## 2022-07-28 DIAGNOSIS — N951 Menopausal and female climacteric states: Secondary | ICD-10-CM

## 2022-07-28 MED ORDER — ESTRADIOL 0.025 MG/24HR TD PTWK: 0.0250 mg | MEDICATED_PATCH | TRANSDERMAL | 12 refills | Status: DC

## 2022-07-28 NOTE — Patient Instructions (Signed)
We have sent in the estrogen patches to do weekly. Let us know in about 4 weeks how you are doing.

## 2022-07-28 NOTE — Assessment & Plan Note (Signed)
Rx estrogen patch 0.025 mg daily patch to change weekly. She will let us know in 1 month if dose is right. She is s/p hysterectomy so progesterone is not required. Counseled about non-hormonal options as well.

## 2022-07-28 NOTE — Progress Notes (Signed)
   Subjective:   Patient ID: Kylie [name], female    DOB: 04-10-1966, 57 y.o.   MRN: [id number]  HPI The patient is a 57 YO female coming in for stable to worse hot flashes.   Review of Systems  Constitutional: Negative.   HENT: Negative.    Eyes: Negative.   Respiratory:  Negative for cough, chest tightness and shortness of breath.   Cardiovascular:  Negative for chest pain, palpitations and leg swelling.  Gastrointestinal:  Negative for abdominal distention, abdominal pain, constipation, diarrhea, nausea and vomiting.  Endocrine: Positive for heat intolerance.  Musculoskeletal: Negative.   Skin: Negative.   Neurological: Negative.   Psychiatric/Behavioral: Negative.      Objective:  Physical Exam Constitutional:      Appearance: She is well-developed.  HENT:     Head: Normocephalic and atraumatic.  Cardiovascular:     Rate and Rhythm: Normal rate and regular rhythm.  Pulmonary:     Effort: Pulmonary effort is normal. No respiratory distress.     Breath sounds: Normal breath sounds. No wheezing or rales.  Abdominal:     General: Bowel sounds are normal. There is no distension.     Palpations: Abdomen is soft.     Tenderness: There is no abdominal tenderness. There is no rebound.  Musculoskeletal:     Cervical back: Normal range of motion.  Skin:    General: Skin is warm and dry.  Neurological:     Mental Status: She is alert and oriented to person, place, and time.     Coordination: Coordination normal.     Vitals:   07/28/22 1447  BP: 118/68  Pulse: 76  Temp: 98 F (36.7 C)  TempSrc: Oral  Weight: 240 lb (108.9 kg)  Height: 5\' 7"  (1.702 m)    Assessment & Plan:

## 2022-08-21 ENCOUNTER — Other Ambulatory Visit (HOSPITAL_COMMUNITY): Payer: Self-pay

## 2022-08-22 ENCOUNTER — Other Ambulatory Visit (HOSPITAL_COMMUNITY): Payer: Self-pay

## 2022-08-29 ENCOUNTER — Telehealth: Payer: Self-pay

## 2022-08-29 ENCOUNTER — Other Ambulatory Visit: Payer: Self-pay | Admitting: Internal Medicine

## 2022-08-29 ENCOUNTER — Other Ambulatory Visit (HOSPITAL_COMMUNITY): Payer: Self-pay

## 2022-08-29 MED ORDER — ZEPBOUND 10 MG/0.5ML ~~LOC~~ SOAJ: 10.0000 mg | SUBCUTANEOUS | 6 refills | Status: AC

## 2022-08-29 NOTE — Telephone Encounter (Signed)
A new encounter has been created. Awaiting clinical questions to generate.

## 2022-08-29 NOTE — Telephone Encounter (Signed)
Patient Advocate Encounter  Prior authorization questions generated and answered.   Submitted: 08-29-2023 Key [id number]   Status is pending

## 2022-08-29 NOTE — Telephone Encounter (Signed)
Patient Advocate Encounter   Received notification from [employer] that prior authorization is required for Zepbound 10MG /0.5ML pen-injectors   Submitted: n/a Key [id number]  Currently awaiting clinical questions to generate

## 2022-09-02 NOTE — Telephone Encounter (Signed)
Pharmacy comment: Alternative Requested:or PA REQUIRED.

## 2022-09-04 NOTE — Telephone Encounter (Signed)
Patient Advocate Encounter  Received a fax from [employer] regarding Prior Authorization for Zepbound 10MG /0.5ML pen-injectors .   Authorization has been DENIED due to        Determination letter attached to patient chart

## 2022-09-25 ENCOUNTER — Other Ambulatory Visit: Payer: Self-pay | Admitting: Internal Medicine

## 2022-12-22 ENCOUNTER — Encounter: Payer: Self-pay | Admitting: Internal Medicine

## 2022-12-22 ENCOUNTER — Ambulatory Visit (INDEPENDENT_AMBULATORY_CARE_PROVIDER_SITE_OTHER): Payer: Self-pay | Admitting: Internal Medicine

## 2022-12-22 VITALS — BP 120/80 | HR 51 | Temp 98.1°F | Ht 67.0 in | Wt 220.0 lb

## 2022-12-22 DIAGNOSIS — N951 Menopausal and female climacteric states: Secondary | ICD-10-CM

## 2022-12-22 DIAGNOSIS — Z0000 Encounter for general adult medical examination without abnormal findings: Secondary | ICD-10-CM

## 2022-12-22 DIAGNOSIS — R7303 Prediabetes: Secondary | ICD-10-CM

## 2022-12-22 LAB — HEMOGLOBIN A1C: Hgb A1c MFr Bld: 5.3 % (ref 4.6–6.5)

## 2022-12-22 LAB — CBC
HCT: 40.4 % (ref 36.0–46.0)
Hemoglobin: 12.7 g/dL (ref 12.0–15.0)
MCHC: 31.4 g/dL (ref 30.0–36.0)
MCV: 88.4 fl (ref 78.0–100.0)
Platelets: 251 10*3/uL (ref 150.0–400.0)
RBC: 4.57 Mil/uL (ref 3.87–5.11)
RDW: 15.1 % (ref 11.5–15.5)
WBC: 5.5 10*3/uL (ref 4.0–10.5)

## 2022-12-22 LAB — COMPREHENSIVE METABOLIC PANEL
ALT: 11 U/L (ref 0–35)
AST: 16 U/L (ref 0–37)
Albumin: 4.1 g/dL (ref 3.5–5.2)
Alkaline Phosphatase: 53 U/L (ref 39–117)
BUN: 14 mg/dL (ref 6–23)
CO2: 28 mEq/L (ref 19–32)
Calcium: 9.5 mg/dL (ref 8.4–10.5)
Chloride: 104 mEq/L (ref 96–112)
Creatinine, Ser: 0.89 mg/dL (ref 0.40–1.20)
GFR: 71.53 mL/min (ref >60.00)
Glucose, Bld: 84 mg/dL (ref 70–99)
Potassium: 4.5 mEq/L (ref 3.5–5.1)
Sodium: 137 mEq/L (ref 135–145)
Total Bilirubin: 0.5 mg/dL (ref 0.2–1.2)
Total Protein: 7.1 g/dL (ref 6.0–8.3)

## 2022-12-22 LAB — LIPID PANEL
Cholesterol: 242 mg/dL — ABNORMAL HIGH (ref 0–200)
HDL: 57.6 mg/dL (ref >39.00)
LDL Cholesterol: 170 mg/dL — ABNORMAL HIGH (ref 0–99)
NonHDL: 184.28
Total CHOL/HDL Ratio: 4
Triglycerides: 69 mg/dL (ref 0.0–149.0)
VLDL: 13.8 mg/dL (ref 0.0–40.0)

## 2022-12-22 MED ORDER — ESTRADIOL 0.025 MG/24HR TD PTWK: 0.0250 mg | MEDICATED_PATCH | TRANSDERMAL | 12 refills | Status: DC

## 2022-12-22 NOTE — Progress Notes (Unsigned)
   Subjective:   Patient ID: Kylie [name], female    DOB: 1965/10/02, 57 y.o.   MRN: [id number]  HPI The patient is here for physical.  PMH, [facility], social history reviewed and updated  Review of Systems  Objective:  Physical Exam  Vitals:   12/22/22 1537  BP: 120/80  Pulse: (!) 51  Temp: 98.1 F (36.7 C)  TempSrc: Oral  SpO2: 92%  Weight: 220 lb (99.8 kg)  Height: 5\' 7"  (1.702 m)    Assessment & Plan:

## 2022-12-23 ENCOUNTER — Other Ambulatory Visit: Payer: Self-pay | Admitting: Internal Medicine

## 2022-12-25 ENCOUNTER — Encounter: Payer: Self-pay | Admitting: Internal Medicine

## 2022-12-25 ENCOUNTER — Other Ambulatory Visit: Payer: Self-pay | Admitting: Internal Medicine

## 2022-12-25 NOTE — Assessment & Plan Note (Signed)
Flu shot yearly. Shingrix declines. Tetanus up to date. Colonoscopy up to date. Mammogram up to date, pap smear up to date. Counseled about sun safety and mole surveillance. Counseled about the dangers of distracted driving. Given 10 year screening recommendations.

## 2022-12-25 NOTE — Assessment & Plan Note (Signed)
Checking Hga1c and she is taking metformin 500 mg BID for prevention. Rx zepbound to help with weight loss as well.

## 2022-12-25 NOTE — Assessment & Plan Note (Signed)
Estrogen patch is helping with this.

## 2022-12-25 NOTE — Assessment & Plan Note (Signed)
Rx zepbound to take for weight loss and taking metformin 500 mg BID. Checking lipid panel and HGA1c and adjust as needed.

## 2022-12-29 ENCOUNTER — Other Ambulatory Visit: Payer: Self-pay | Admitting: Internal Medicine

## 2023-01-30 ENCOUNTER — Other Ambulatory Visit: Payer: Self-pay | Admitting: Internal Medicine

## 2023-06-09 ENCOUNTER — Encounter: Payer: Self-pay | Admitting: Gastroenterology

## 2023-07-23 ENCOUNTER — Ambulatory Visit (INDEPENDENT_AMBULATORY_CARE_PROVIDER_SITE_OTHER): Admitting: Emergency Medicine

## 2023-07-23 ENCOUNTER — Encounter: Payer: Self-pay | Admitting: Emergency Medicine

## 2023-07-23 VITALS — BP 120/78 | HR 65 | Temp 98.2°F | Ht 67.0 in | Wt 221.0 lb

## 2023-07-23 DIAGNOSIS — R519 Headache, unspecified: Secondary | ICD-10-CM | POA: Diagnosis not present

## 2023-07-23 DIAGNOSIS — J0100 Acute maxillary sinusitis, unspecified: Secondary | ICD-10-CM | POA: Diagnosis not present

## 2023-07-23 DIAGNOSIS — R0981 Nasal congestion: Secondary | ICD-10-CM | POA: Diagnosis not present

## 2023-07-23 MED ORDER — FLUTICASONE PROPIONATE 50 MCG/ACT NA SUSP: 2.0000 | Freq: Every day | NASAL | 6 refills | Status: DC

## 2023-07-23 MED ORDER — AMOXICILLIN-POT CLAVULANATE 875-125 MG PO TABS: 1.0000 | ORAL_TABLET | Freq: Two times a day (BID) | ORAL | 0 refills | Status: AC

## 2023-07-23 NOTE — Assessment & Plan Note (Signed)
 Pain management discussed Recommend Tylenol and or Advil as needed Start antibiotic today

## 2023-07-23 NOTE — Progress Notes (Signed)
 Kylie [name] 58 y.o.   Chief Complaint  Patient presents with   Sinusitis    Patient states it started as a headache on Monday. Now right eye and face has pressure and headache. She has only been taking tylenol that helps for a short time     HISTORY OF PRESENT ILLNESS: This is a 58 y.o. female complaining of sinus headache and pressure since last Monday mostly on the right side of her face Took some over-the-counter medication. No other associated symptoms. No other complaints or medical concerns today.  Sinusitis Associated symptoms include congestion and headaches. Pertinent negatives include no chills, coughing or shortness of breath.     Prior to Admission medications   Medication Sig Start Date End Date Taking? Authorizing Provider  estradiol (CLIMARA - DOSED IN MG/24 HR) 0.025 mg/24hr patch Place 1 patch (0.025 mg total) onto the skin once a week. 12/22/22  Yes Myrlene Broker, MD  ibuprofen (ADVIL,MOTRIN) 800 MG tablet Take 800 mg by mouth every 8 (eight) hours as needed. for pain 06/24/18  Yes [provider]  Insulin Pen Needle (PEN NEEDLES) 32G X 4 MM MISC Use with mounjaro 05/09/22  Yes Myrlene Broker, MD  metFORMIN (GLUCOPHAGE) 500 MG tablet TAKE 1 TABLET BY MOUTH 2 TIMES DAILY WITH A MEAL. ANNUAL APPT IS DUE MUST SEE FOR FUTURE REFILLS 12/30/22  Yes Myrlene Broker, MD  tirzepatide [facility]) 10 MG/0.5ML Pen Inject 10 mg into the skin once a week. 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 11/18/2018   Arthritis 06/02/2018   Routine general medical examination at a health care facility 08/01/2016   Morbid obesity (HCC) 11/15/2015   Varicose veins with pain 06/17/2015    Past Medical History:  Diagnosis  Date   Anemia     Past Surgical History:  Procedure Laterality Date   ABDOMINAL HYSTERECTOMY     CESAREAN SECTION     OVARIAN CYST SURGERY      Social History   Socioeconomic History   Marital status: Married    Spouse name: Not on file   Number of children: Not on file   Years of education: Not on file   Highest education level: Not on file  Occupational History   Not on file  Tobacco Use   Smoking status: Former   Smokeless tobacco: Never  Vaping Use   Vaping status: Never Used  Substance and Sexual Activity   Alcohol use: No   Drug use: No   Sexual activity: Not on file  Other Topics Concern   Not on file  Social History Narrative   Not on file   Social Drivers of Health   Financial Resource Strain: Not on file  Food Insecurity: Not on file  Transportation Needs: Not on file  Physical Activity: Not on file  Stress: Not on file  Social Connections: Not on file  Intimate Partner Violence: Not on file    Family History  Problem Relation Age of Onset   Hyperlipidemia Mother    Hypertension Mother    Mental illness Mother    Diabetes Mother    Hyperlipidemia Maternal Grandmother    Hypertension Maternal Grandmother    Hyperlipidemia Maternal Grandfather    Hypertension Maternal Grandfather  Heart disease Paternal Grandmother    Heart disease Paternal Grandfather    Colon cancer Maternal Aunt    Colon cancer Paternal Aunt    Stomach cancer Maternal Aunt    Breast cancer Neg Hx    Esophageal cancer Neg Hx    Rectal cancer Neg Hx    Colon polyps Neg Hx      Review of Systems  Constitutional: Negative.  Negative for chills and fever.  HENT:  Positive for congestion and sinus pain.   Respiratory: Negative.  Negative for cough and shortness of breath.   Cardiovascular: Negative.  Negative for chest pain and palpitations.  Gastrointestinal:  Negative for abdominal pain, diarrhea, nausea and vomiting.  Genitourinary: Negative.  Negative for dysuria  and hematuria.  Skin: Negative.  Negative for rash.  Neurological:  Positive for headaches.  All other systems reviewed and are negative.   Vitals:   07/23/23 1015  BP: 120/78  Pulse: 65  Temp: 98.2 F (36.8 C)  SpO2: 97%    Physical Exam Vitals reviewed.  Constitutional:      Appearance: Normal appearance.  HENT:     Head: Normocephalic.     Right Ear: Tympanic membrane, ear canal and external ear normal.     Left Ear: Tympanic membrane, ear canal and external ear normal.     Nose:     Right Sinus: Maxillary sinus tenderness present.     Mouth/Throat:     Mouth: Mucous membranes are moist.     Pharynx: Oropharynx is clear.  Eyes:     Extraocular Movements: Extraocular movements intact.     Conjunctiva/sclera: Conjunctivae normal.     Pupils: Pupils are equal, round, and reactive to light.  Cardiovascular:     Rate and Rhythm: Normal rate and regular rhythm.     Pulses: Normal pulses.     Heart sounds: Normal heart sounds.  Pulmonary:     Effort: Pulmonary effort is normal.     Breath sounds: Normal breath sounds.  Musculoskeletal:     Cervical back: No tenderness.  Lymphadenopathy:     Cervical: No cervical adenopathy.  Skin:    General: Skin is warm and dry.  Neurological:     Mental Status: She is alert and oriented to person, place, and time.  Psychiatric:        Mood and Affect: Mood normal.        Behavior: Behavior normal.      ASSESSMENT & PLAN: A total of 33 minutes was spent with the patient and counseling/coordination of care regarding preparing for this visit, review of most recent office visit notes, review of multiple chronic medical conditions and their management, review of all medications, diagnosis of acute bacterial infection and need to start antibiotics, symptom management, prognosis, documentation, and need for follow up if no better or worse during the next several days or weeks.   Problem List Items Addressed This Visit        Respiratory   Acute non-recurrent maxillary sinusitis - Primary   Viral infection now with secondary bacterial infection Progressively getting worse Recommend to start Augmentin 875 mg twice a day for 7 days Symptom management discussed Advised to rest and stay well-hydrated Advised to contact the office if no better or worse during the next several days      Relevant Medications   amoxicillin-clavulanate (AUGMENTIN) 875-125 MG tablet   fluticasone (FLONASE) 50 MCG/ACT nasal spray   Sinus congestion   Recommend to avoid over-the-counter antihistamines  May take oral decongestants Recommend frequent nasal saline sprays during the day Recommend to start Flonase twice a day      Relevant Medications   fluticasone (FLONASE) 50 MCG/ACT nasal spray     Other   Sinus headache   Pain management discussed Recommend Tylenol and or Advil as needed Start antibiotic today      Patient Instructions  Sinus Infection, Adult A sinus infection is soreness and swelling (inflammation) of your sinuses. Sinuses are hollow spaces in the bones around your face. They are located: Around your eyes. In the middle of your forehead. Behind your nose. In your cheekbones. Your sinuses and nasal passages are lined with a fluid called mucus. Mucus drains out of your sinuses. Swelling can trap mucus in your sinuses. This lets germs (bacteria, virus, or fungus) grow, which leads to infection. Most of the time, this condition is caused by a virus. What are the causes? Allergies. Asthma. Germs. Things that block your nose or sinuses. Growths in the nose (nasal polyps). Chemicals or irritants in the air. A fungus. This is rare. What increases the risk? Having a weak body defense system (immune system). Doing a lot of swimming or diving. Using nasal sprays too much. Smoking. What are the signs or symptoms? The main symptoms of this condition are pain and a feeling of pressure around the sinuses. Other  symptoms include: Stuffy nose (congestion). This may make it hard to breathe through your nose. Runny nose (drainage). Soreness, swelling, and warmth in the sinuses. A cough that may get worse at night. Being unable to smell and taste. Mucus that collects in the throat or the back of the nose (postnasal drip). This may cause a sore throat or bad breath. Being very tired (fatigued). A fever. How is this diagnosed? Your symptoms. Your medical history. A physical exam. Tests to find out if your condition is short-term (acute) or long-term (chronic). Your doctor may: Check your nose for growths (polyps). Check your sinuses using a tool that has a light on one end (endoscope). Check for allergies or germs. Do imaging tests, such as an MRI or CT scan. How is this treated? Treatment for this condition depends on the cause and whether it is short-term or long-term. If caused by a virus, your symptoms should go away on their own within 10 days. You may be given medicines to relieve symptoms. They include: Medicines that shrink swollen tissue in the nose. A spray that treats swelling of the nostrils. Rinses that help get rid of thick mucus in your nose (nasal saline washes). Medicines that treat allergies (antihistamines). Over-the-counter pain relievers. If caused by bacteria, your doctor may wait to see if you will get better without treatment. You may be given antibiotic medicine if you have: A very bad infection. A weak body defense system. If caused by growths in the nose, surgery may be needed. Follow these instructions at home: Medicines Take, use, or apply over-the-counter and prescription medicines only as told by your doctor. These may include nasal sprays. If you were prescribed an antibiotic medicine, take it as told by your doctor. Do not stop taking it even if you start to feel better. Hydrate and humidify  Drink enough water to keep your pee (urine) pale yellow. Use a cool  mist humidifier to keep the humidity level in your home above 50%. Breathe in steam for 10-15 minutes, 3-4 times a day, or as told by your doctor. You can do this in the bathroom  while a hot shower is running. Try not to spend time in cool or dry air. Rest Rest as much as you can. Sleep with your head raised (elevated). Make sure you get enough sleep each night. General instructions  Put a warm, moist washcloth on your face 3-4 times a day, or as often as told by your doctor. Use nasal saline washes as often as told by your doctor. Wash your hands often with soap and water. If you cannot use soap and water, use hand sanitizer. Do not smoke. Avoid being around people who are smoking (secondhand smoke). Keep all follow-up visits. Contact a doctor if: You have a fever. Your symptoms get worse. Your symptoms do not get better within 10 days. Get help right away if: You have a very bad headache. You cannot stop vomiting. You have very bad pain or swelling around your face or eyes. You have trouble seeing. You feel confused. Your neck is stiff. You have trouble breathing. These symptoms may be an emergency. Get help right away. Call 911. Do not wait to see if the symptoms will go away. Do not drive yourself to the hospital. Summary A sinus infection is swelling of your sinuses. Sinuses are hollow spaces in the bones around your face. This condition is caused by tissues in your nose that become inflamed or swollen. This traps germs. These can lead to infection. If you were prescribed an antibiotic medicine, take it as told by your doctor. Do not stop taking it even if you start to feel better. Keep all follow-up visits. This information is not intended to replace advice given to you by your health care provider. Make sure you discuss any questions you have with your health care provider. Document Revised: 03/19/2021 Document Reviewed: 03/19/2021 Elsevier Patient Education  2024 Elsevier  Inc.    Edwina Barth, MD [facility]

## 2023-07-23 NOTE — Assessment & Plan Note (Signed)
 Viral infection now with secondary bacterial infection Progressively getting worse Recommend to start Augmentin 875 mg twice a day for 7 days Symptom management discussed Advised to rest and stay well-hydrated Advised to contact the office if no better or worse during the next several days

## 2023-07-23 NOTE — Patient Instructions (Signed)

## 2023-07-23 NOTE — Assessment & Plan Note (Signed)
 Recommend to avoid over-the-counter antihistamines May take oral decongestants Recommend frequent nasal saline sprays during the day Recommend to start Flonase twice a day

## 2023-08-10 ENCOUNTER — Ambulatory Visit: Payer: Self-pay

## 2023-08-10 NOTE — Telephone Encounter (Signed)
 Reason for Triage: yeast infection from antibiotics- started about a week ago and getting worse - [phone]  Chief Complaint: vaginal discharge Symptoms: vaginal discharge-clear, vaginal irritation inside vagina Frequency: Friday Pertinent Negatives: Patient denies foul odor, fever Disposition: [] ED /[] Urgent Care (no appt availability in office) / [] Appointment(In office/virtual)/ []  [facility]/ [] Home Care/ [] Refused Recommended Disposition /[] [facility]/ [x]  Follow-up with PCP Additional Notes: given ABX by Dr. Malone Sear at last office visit- completed medication and now has developed a yeast infection.  Pt stated vagina became very irritated on Friday and is ongoing - now having vaginal discharge as well.  Pt would like PCP to call in medication for yeast infection to pharmacy for pick up  Reason for Disposition  Symptoms of a vaginal yeast infection (i.e., white, thick, cottage-cheese-like, itchy, not bad smelling discharge)    Will route information to PCP to request RX for pt  Answer Assessment - Initial Assessment Questions 1. DISCHARGE: "Describe the discharge." (e.g., white, yellow, green, gray, foamy, cottage cheese-like)     White discharge 2. ODOR: "Is there a bad odor?"     no 3. ONSET: "When did the discharge begin?"     Friday 4. RASH: "Is there a rash in the genital area?" If Yes, ask: "Describe it." (e.g., redness, blisters, sores, bumps)     no 5. ABDOMEN PAIN: "Are you having any abdomen pain?" If Yes, ask: "What does it feel like? " (e.g., crampy, dull, intermittent, constant)      no 6. ABDOMEN PAIN SEVERITY: If present, ask: "How bad is it?" (e.g., Scale 1-10; mild, moderate, or severe)   - MILD (1-3): Doesn't interfere with normal activities, abdomen soft and not tender to touch.    - MODERATE (4-7): Interferes with normal activities or awakens from sleep, abdomen tender to touch.    - SEVERE (8-10): Excruciating pain, doubled over,  unable to do any normal activities. (R/O peritonitis)      no 7. CAUSE: "What do you think is causing the discharge?" "Have you had the same problem before? What happened then?"     no 8. OTHER SYMPTOMS: "Do you have any other symptoms?" (e.g., fever, itching, vaginal bleeding, pain with urination, injury to genital area, vaginal foreign body)     Irritation inside, vaginal discharge,  9. PREGNANCY: "Is there any chance you are pregnant?" "When was your last menstrual period?"     N/a  Protocols used: Vaginal Discharge-A-AH

## 2023-08-11 ENCOUNTER — Other Ambulatory Visit: Payer: Self-pay | Admitting: Internal Medicine

## 2023-08-11 MED ORDER — FLUCONAZOLE 150 MG PO TABS: 150.0000 mg | ORAL_TABLET | Freq: Once | ORAL | 0 refills | Status: AC

## 2023-08-11 NOTE — Telephone Encounter (Signed)
 Additional Notes: given ABX by Dr. Malone Sear at last office visit- completed medication and now has developed a yeast infection.  Pt stated vagina became very irritated on Friday and is ongoing - now having vaginal discharge as well.  Pt would like PCP to call in medication for yeast infection to pharmacy for pick up

## 2023-08-11 NOTE — Telephone Encounter (Signed)
 Sent in diflucan to pharmacy on file.

## 2024-01-13 ENCOUNTER — Ambulatory Visit: Payer: Self-pay | Admitting: *Deleted

## 2024-01-13 NOTE — Telephone Encounter (Signed)
 Copied from CRM [id number]. Topic: Clinical - Red Word Triage >> Jan 13, 2024  9:20 AM Jasmin G wrote: [facility] that prompted transfer to Nurse Triage: Pt initially called to schedule an appt to see her PCP preferably, Dr. Rollene due to experiencing pain that goes from the bottom of her hands down to her to wrists, pain is more apparent when she picks things up, pt also wanted to discuss her weight with PCP at appt. Reason for Disposition  [1] MODERATE pain (e.g., interferes with normal activities) AND [2] present > 3 days  Answer Assessment - Initial Assessment Questions 1. ONSET: When did the pain start?     I'm having pain in my hands into my wrists.   Started a week ago.   If I left something like groceries.   2. LOCATION: Where is the pain located?     The ball of my left hand and into my wrist.   It was both but mostly my left hand. 3. PAIN: How bad is the pain? (Scale 1-10; or mild, moderate, severe)     Using it 6-7/10. 4. WORK OR EXERCISE: Has there been any recent work or exercise that involved this part (i.e., hand or wrist) of the body?     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 CAUSE: What do you think is causing the pain?     I don't know 6. AGGRAVATING FACTORS: What makes the pain worse? (e.g., using computer)     Lifting things like groceries 7. OTHER SYMPTOMS: Do you have any other symptoms? (e.g., fever, neck pain, numbness or tingling, rash, swelling)     No numbness or tingling.        8. PREGNANCY: Is there any chance you are pregnant? When was your last menstrual period?     Not asked  Protocols used: Hand Pain-A-AH FYI Only or Action Required?: FYI only for provider.  Patient was last seen in primary care on 07/23/2023 by Purcell Emil Schanz, MD.  Called Nurse Triage reporting Hand Pain.Pain in left hand from ball of hand into wrist when lifts something with weight to it.  Symptoms began a week ago.  Interventions attempted: Nothing.  Symptoms  are: gradually worsening.  Triage Disposition: See PCP When Office is Open (Within 3 Days)  Patient/caregiver understands and will follow disposition?: Yes

## 2024-01-14 NOTE — Progress Notes (Unsigned)
    Subjective:    Patient ID: Kylie [name], female    DOB: 1965-05-21, 58 y.o.   MRN: [id number]      HPI [name] is here for No chief complaint on file.  Palm of hands - left > right pain extending down to wrists b/l x 1 week.  Pain 6-7/10.  No N/T.  Worse with lifting things. Has not tried anything.       Medications and allergies reviewed with patient and updated if appropriate.  Current Outpatient Medications on File Prior to Visit  Medication Sig Dispense Refill   estradiol  (CLIMARA  - DOSED IN MG/24 HR) 0.025 mg/24hr patch Place 1 patch (0.025 mg total) onto the skin once a week. 4 patch 12   fluticasone  (FLONASE ) 50 MCG/ACT nasal spray Place 2 sprays into both nostrils daily. 16 g 6   ibuprofen  (ADVIL ,MOTRIN ) 800 MG tablet Take 800 mg by mouth every 8 (eight) hours as needed. for pain     Insulin Pen Needle (PEN NEEDLES) 32G X 4 MM MISC Use with mounjaro  30 each 0   metFORMIN  (GLUCOPHAGE ) 500 MG tablet TAKE 1 TABLET BY MOUTH 2 TIMES DAILY WITH A MEAL. ANNUAL APPT IS DUE MUST SEE FOR FUTURE REFILLS 60 tablet 2   tirzepatide  (ZEPBOUND ) 10 MG/0.5ML Pen Inject 10 mg into the skin once a week. 2 mL 6   No current facility-administered medications on file prior to visit.    Review of Systems     Objective:  There were no vitals filed for this visit. BP Readings from Last 3 Encounters:  07/23/23 120/78  12/22/22 120/80  07/28/22 118/68   Wt Readings from Last 3 Encounters:  07/23/23 221 lb (100.2 kg)  12/22/22 220 lb (99.8 kg)  07/28/22 240 lb (108.9 kg)   There is no height or weight on file to calculate BMI.    Physical Exam         Assessment & Plan:    See Problem List for Assessment and Plan of chronic medical problems.

## 2024-01-15 ENCOUNTER — Ambulatory Visit: Admitting: Internal Medicine

## 2024-01-15 ENCOUNTER — Encounter: Payer: Self-pay | Admitting: Internal Medicine

## 2024-01-15 VITALS — BP 126/88 | HR 66 | Temp 98.0°F | Ht 67.0 in | Wt 217.0 lb

## 2024-01-15 DIAGNOSIS — M79642 Pain in left hand: Secondary | ICD-10-CM | POA: Diagnosis not present

## 2024-01-15 MED ORDER — IBUPROFEN 800 MG PO TABS: 800.0000 mg | ORAL_TABLET | Freq: Two times a day (BID) | ORAL | 0 refills | Status: DC | PRN

## 2024-01-15 NOTE — Patient Instructions (Addendum)
      Buy a wrist brace at the pharmacy.      Medications changes include :   ibuprofen  800 mg twice daily as needed    Return if symptoms worsen or fail to improve.

## 2024-01-20 ENCOUNTER — Other Ambulatory Visit: Payer: Self-pay | Admitting: Internal Medicine

## 2024-02-02 ENCOUNTER — Other Ambulatory Visit: Payer: Self-pay | Admitting: Internal Medicine

## 2024-02-21 ENCOUNTER — Other Ambulatory Visit: Payer: Self-pay | Admitting: Internal Medicine

## 2024-03-04 ENCOUNTER — Ambulatory Visit: Payer: Self-pay

## 2024-03-04 ENCOUNTER — Encounter: Payer: Self-pay | Admitting: Family

## 2024-03-04 ENCOUNTER — Telehealth (INDEPENDENT_AMBULATORY_CARE_PROVIDER_SITE_OTHER): Admitting: Family

## 2024-03-04 VITALS — BP 128/68 | Ht 67.0 in | Wt 220.0 lb

## 2024-03-04 DIAGNOSIS — R0981 Nasal congestion: Secondary | ICD-10-CM

## 2024-03-04 DIAGNOSIS — H00014 Hordeolum externum left upper eyelid: Secondary | ICD-10-CM | POA: Diagnosis not present

## 2024-03-04 MED ORDER — FLUTICASONE PROPIONATE 50 MCG/ACT NA SUSP: NASAL | 1 refills | Status: AC

## 2024-03-04 MED ORDER — ERYTHROMYCIN 5 MG/GM OP OINT: 1.0000 | TOPICAL_OINTMENT | Freq: Four times a day (QID) | OPHTHALMIC | 0 refills | Status: AC

## 2024-03-04 NOTE — Telephone Encounter (Signed)
 FYI Only or Action Required?: FYI only for provider: appointment scheduled on [facility] today.  Patient was last seen in primary care on 01/15/2024 by Geofm Glade PARAS, MD.  Called Nurse Triage reporting Eye Problem - sty.  Symptoms began several days ago.  Interventions attempted: Ice/heat application Pt osed cold compress.  Symptoms are: unchanged.  Triage Disposition: Home Care  Patient/caregiver understands and will follow disposition?: No, refuses disposition - pt wants to be seen.                     Answer Assessment - Initial Assessment Questions 1. LOCATION: Which eye has the sty? Upper or lower eyelid?     Left upper - now is swollen below eye as well 2. SIZE: How big is it? (Note: standard pencil eraser is 6 mm)     moderate 3. EYELID: Is the eyelid swollen? If Yes, ask: How much?     yes 4. REDNESS: Has the redness spread onto the eyelid?     moderate 5. ONSET: When did you notice the sty?     2 days ago 6. VISION: Do you have blurred vision?      no 7. PAIN: Is it painful? If Yes, ask: How bad is the pain?  (Scale 1-10; or mild, moderate, severe)     Yes - to touch  9. OTHER SYMPTOMS: Do you have any other symptoms? (e.g., fever)     itching  Protocols used: Sty-A-AH

## 2024-03-04 NOTE — Telephone Encounter (Signed)
 Copied from CRM [id number]. Topic: Clinical - Red Word Triage >> Mar 04, 2024 10:26 AM Kylie [name] wrote: Red Word that prompted transfer to Nurse Triage: Stye on left eye-very red, itchy and tender to the touch-cheek is swollen- started 2 days ago Reason for Disposition  Sty on eyelid  Answer Assessment - Initial Assessment Questions 1. ONSET: When did the swelling start? (e.g., minutes, hours, days)     3rd day 2. LOCATION: What part of the eyelid is swollen?     left 3. SEVERITY: How swollen is it? (e.g., describe; mild, moderate, or severe)     moderate 4. ITCHING: Is there any itching? If Yes, ask: How much?   (Scale 1-10; mild, moderate or severe)     yes 5. PAIN: Is the swelling painful to touch? If Yes, ask: How painful is it?   (Scale 1-10; mild, moderate or severe)     No - where the bump it it uncomfortable 6. FEVER: Do you have a fever? If Yes, ask: What is it, how was it measured, and when did it start?      no 7. CAUSE: What do you think is causing the swelling?     Sty - unknown 8. RECURRENT SYMPTOM: Have you had eyelid swelling before? If Yes, ask: When was the last time? What happened that time?    no 9. OTHER SYMPTOMS: Do you have any other symptoms? (e.g., blurred vision, eye discharge, rash, runny nose)     No - under the eye is puffy  Protocols used: Eyelid Swelling-A-AH, Sty-A-AH

## 2024-03-04 NOTE — Progress Notes (Signed)
 MyChart Video Visit    Virtual Visit via Video Note   This format is felt to be most appropriate for this patient at this time. Physical exam was limited by quality of the video and audio technology used for the visit. CMA was able to get the patient set up on a video visit.  Patient location: Home. Patient and provider in visit Provider location: Office  I discussed the limitations of evaluation and management by telemedicine and the availability of in person appointments. The patient expressed understanding and agreed to proceed.  Visit Date: 03/04/2024  Today's healthcare provider: Lucius Krabbe, NP     Subjective:   Patient ID: Kylie [name], female    DOB: 1965/07/14, 58 y.o.   MRN: [id number]  Chief Complaint  Patient presents with   Stye    Left eye. Wants to ensure it's not infected.   HPI: Left upper eyelid swelling starting Monday night & worse on Tuesday, better today. Had originally covered her whole eye & socket, now covering just the eyelid. She denies any irritation or pain in the eye, or any redness in the eye. The stye is tender, has not come to a head, nor any drainage noted. She was applying warm compresses, but felt like this was making it worse. She states this am she noted she was having some facial swelling under her left eye and this was concerning.  ASSESSMENT & PLAN  1. Sinus congestion Possibly allergy related, puffy swelling noted on left side of face. Advised to use the Flonase  (has used in past) bid x 3d, then qd for a week to see if helps sx. Denies any other sinus sx currently.  - fluticasone  (FLONASE ) 50 MCG/ACT nasal spray; For sinus symptoms. 1 spray each nostril twice a day for 3 days, then reduce to 1 spray each nostril daily.  Dispense: 16 g; Refill: 1  2. Hordeolum externum of left upper eyelid (Primary) Unsure if possibly an allergic reaction as she also now has some non-tender facial puffiness in 2 areas on left side of upper  cheek below the eye. Advised to continue the warm compresses at least 3 times daily. Try not to rub eyelid. Sending abt ointment to use sparingly along edge of eyelid and inside eye if drainage noted. She is planning to call eye doctor for vision exam, advised can also f/u regarding stye if still not better.  - erythromycin ophthalmic ointment; Place 1 Application into the left eye 4 (four) times daily for 7 days. Apply a thin layer along edge of eyelid along eyelashes. Administer in the eye if any drainage noted from the stye.  Dispense: 28 g; Refill: 0  Past Medical History:  Diagnosis Date   Anemia     Past Surgical History:  Procedure Laterality Date   ABDOMINAL HYSTERECTOMY     CESAREAN SECTION     OVARIAN CYST SURGERY      Outpatient Medications Prior to Visit  Medication Sig Dispense Refill   estradiol  (CLIMARA  - DOSED IN MG/24 HR) 0.025 mg/24hr patch PLACE 1 PATCH (0.025 MG TOTAL) ONTO THE SKIN ONCE A WEEK. 12 patch 5   ibuprofen  (ADVIL ) 800 MG tablet TAKE 1 TABLET (800 MG TOTAL) BY MOUTH 2 (TWO) TIMES DAILY AS NEEDED. FOR PAIN 30 tablet 0   Insulin Pen Needle (PEN NEEDLES) 32G X 4 MM MISC Use with mounjaro  30 each 0   metFORMIN  (GLUCOPHAGE ) 500 MG tablet TAKE 1 TABLET BY MOUTH 2 TIMES DAILY WITH  A MEAL. ANNUAL APPT IS DUE MUST SEE FOR FUTURE REFILLS 60 tablet 2   tirzepatide  (ZEPBOUND ) 10 MG/0.5ML Pen Inject 10 mg into the skin once a week. 2 mL 6   fluticasone  (FLONASE ) 50 MCG/ACT nasal spray Place 2 sprays into both nostrils daily. 16 g 6   No facility-administered medications prior to visit.    No Known Allergies     Objective:   Physical Exam Vitals and nursing note reviewed.  Constitutional:      General: Pt is not in acute distress.    Appearance: Normal appearance.  HENT:     Head: Normocephalic. Puffiness noted along left upper cheek under left eye. Left upper eyelid with erythema and swelling. Pulmonary:     Effort: No respiratory distress.   Musculoskeletal:     Cervical back: Normal range of motion.  Skin:    General: Skin is dry.     Coloration: Skin is not pale.  Neurological:     Mental Status: Pt is alert and oriented to person, place, and time.  Psychiatric:        Mood and Affect: Mood normal.   BP 128/68   Ht 5' 7 (1.702 m)   Wt 220 lb (99.8 kg)   BMI 34.46 kg/m   Wt Readings from Last 3 Encounters:  03/04/24 220 lb (99.8 kg)  01/15/24 217 lb (98.4 kg)  07/23/23 221 lb (100.2 kg)   I discussed the assessment and treatment plan with the patient. The patient was provided an opportunity to ask questions and all were answered. The patient agreed with the plan and demonstrated an understanding of the instructions.   The patient was advised to call back or seek an in-person evaluation if the symptoms worsen or if the condition fails to improve as anticipated.  Lucius Krabbe, NP [facility] [phone] (phone) [phone] (fax)  [facility]
